# Patient Record
Sex: Female | Born: 1935 | Race: White | Hispanic: No | Smoking: Never smoker
Health system: Southern US, Community
[De-identification: ages and names within clinical notes are randomized; demographics above are authoritative.]

## PROBLEM LIST (undated history)

## (undated) DIAGNOSIS — T4145XA Adverse effect of unspecified anesthetic, initial encounter: Secondary | ICD-10-CM

## (undated) DIAGNOSIS — M1990 Unspecified osteoarthritis, unspecified site: Secondary | ICD-10-CM

## (undated) DIAGNOSIS — I499 Cardiac arrhythmia, unspecified: Secondary | ICD-10-CM

## (undated) DIAGNOSIS — I951 Orthostatic hypotension: Secondary | ICD-10-CM

## (undated) DIAGNOSIS — N183 Chronic kidney disease, stage 3 (moderate): Secondary | ICD-10-CM

## (undated) DIAGNOSIS — F0390 Unspecified dementia without behavioral disturbance: Secondary | ICD-10-CM

## (undated) DIAGNOSIS — T8859XA Other complications of anesthesia, initial encounter: Secondary | ICD-10-CM

## (undated) DIAGNOSIS — Z9181 History of falling: Secondary | ICD-10-CM

## (undated) DIAGNOSIS — C801 Malignant (primary) neoplasm, unspecified: Secondary | ICD-10-CM

## (undated) HISTORY — PX: CHOLECYSTECTOMY: SHX55

## (undated) HISTORY — PX: ABDOMINAL HYSTERECTOMY: SHX81

## (undated) HISTORY — PX: BLADDER SURGERY: SHX569

---

## 2013-08-20 DIAGNOSIS — I951 Orthostatic hypotension: Secondary | ICD-10-CM

## 2013-08-20 DIAGNOSIS — N183 Chronic kidney disease, stage 3 unspecified: Secondary | ICD-10-CM

## 2013-08-20 HISTORY — DX: Chronic kidney disease, stage 3 unspecified: N18.30

## 2013-08-20 HISTORY — DX: Orthostatic hypotension: I95.1

## 2013-08-24 ENCOUNTER — Encounter (HOSPITAL_COMMUNITY): Payer: Self-pay | Admitting: Emergency Medicine

## 2013-08-24 ENCOUNTER — Emergency Department (HOSPITAL_COMMUNITY): Payer: Medicare Other

## 2013-08-24 ENCOUNTER — Emergency Department (HOSPITAL_COMMUNITY)
Admission: EM | Admit: 2013-08-24 | Discharge: 2013-08-24 | Disposition: A | Discharge status: Home / Self Care | Payer: Medicare Other | Attending: Emergency Medicine | Admitting: Emergency Medicine

## 2013-08-24 DIAGNOSIS — Z79899 Other long term (current) drug therapy: Secondary | ICD-10-CM | POA: Insufficient documentation

## 2013-08-24 DIAGNOSIS — IMO0002 Reserved for concepts with insufficient information to code with codable children: Secondary | ICD-10-CM | POA: Insufficient documentation

## 2013-08-24 DIAGNOSIS — M79609 Pain in unspecified limb: Secondary | ICD-10-CM

## 2013-08-24 DIAGNOSIS — M7989 Other specified soft tissue disorders: Secondary | ICD-10-CM

## 2013-08-24 DIAGNOSIS — G8911 Acute pain due to trauma: Secondary | ICD-10-CM | POA: Insufficient documentation

## 2013-08-24 DIAGNOSIS — M79603 Pain in arm, unspecified: Secondary | ICD-10-CM

## 2013-08-24 DIAGNOSIS — R079 Chest pain, unspecified: Secondary | ICD-10-CM | POA: Insufficient documentation

## 2013-08-24 DIAGNOSIS — M129 Arthropathy, unspecified: Secondary | ICD-10-CM | POA: Insufficient documentation

## 2013-08-24 HISTORY — DX: Unspecified osteoarthritis, unspecified site: M19.90

## 2013-08-24 LAB — BASIC METABOLIC PANEL
BUN: 25 mg/dL — ABNORMAL HIGH (ref 6–23)
CO2: 25 mEq/L (ref 19–32)
CREATININE: 1.2 mg/dL — AB (ref 0.50–1.10)
Calcium: 9.4 mg/dL (ref 8.4–10.5)
Chloride: 100 mEq/L (ref 96–112)
GFR calc non Af Amer: 40 mL/min — ABNORMAL LOW (ref >90)
GFR, EST AFRICAN AMERICAN: 47 mL/min — AB (ref >90)
Glucose, Bld: 118 mg/dL — ABNORMAL HIGH (ref 70–99)
POTASSIUM: 4.2 meq/L (ref 3.7–5.3)
Sodium: 138 mEq/L (ref 137–147)

## 2013-08-24 LAB — CBC
HCT: 32.3 % — ABNORMAL LOW (ref 36.0–46.0)
HEMOGLOBIN: 11 g/dL — AB (ref 12.0–15.0)
MCH: 31.4 pg (ref 26.0–34.0)
MCHC: 34.1 g/dL (ref 30.0–36.0)
MCV: 92.3 fL (ref 78.0–100.0)
Platelets: 200 10*3/uL (ref 150–400)
RBC: 3.5 MIL/uL — ABNORMAL LOW (ref 3.87–5.11)
RDW: 13.8 % (ref 11.5–15.5)
WBC: 7.6 10*3/uL (ref 4.0–10.5)

## 2013-08-24 LAB — I-STAT TROPONIN, ED
TROPONIN I, POC: 0 ng/mL (ref 0.00–0.08)
TROPONIN I, POC: 0.01 ng/mL (ref 0.00–0.08)

## 2013-08-24 MED ORDER — FENTANYL CITRATE 0.05 MG/ML IJ SOLN
50.0000 ug | Freq: Once | INTRAMUSCULAR | Status: AC
  Administered 2013-08-24: 50 ug via INTRAVENOUS
  Filled 2013-08-24: qty 2

## 2013-08-24 MED ORDER — HYDROMORPHONE HCL 4 MG PO TABS: 4.0000 mg | ORAL_TABLET | ORAL | Status: DC | PRN

## 2013-08-24 NOTE — ED Notes (Signed)
Dopplar tech at bedside.

## 2013-08-24 NOTE — Discharge Instructions (Signed)
You came to the ER today with swelling and pain of the arm. We did an ultrasound to make sure you do not have a blood clot in your arm. It did not show a blood clot. You will need to followup with your orthopedic doctor as an outpatient. In the meantime, I am prescribing a some stronger pain medication to use this weekend.  Your caregiver has diagnosed you as having chest pain that is not specific for one problem, but does not require admission.  You are at low risk for an acute heart condition or other serious illness. Chest pain comes from many different causes.  SEEK IMMEDIATE MEDICAL ATTENTION IF: You have severe chest pain, especially if the pain is crushing or pressure-like and spreads to the arms, back, neck, or jaw, or if you have sweating, nausea (feeling sick to your stomach), or shortness of breath. THIS IS AN EMERGENCY. Don't wait to see if the pain will go away. Get medical help at once. Call 911 or 0 (operator). DO NOT drive yourself to the hospital.  Your chest pain gets worse and does not go away with rest.  You have an attack of chest pain lasting longer than usual, despite rest and treatment with the medications your caregiver has prescribed.  You wake from sleep with chest pain or shortness of breath.  You feel dizzy or faint.  You have chest pain not typical of your usual pain for which you originally saw your caregiver.

## 2013-08-24 NOTE — ED Provider Notes (Signed)
CSN: [id number]     Arrival date & time 08/24/13  1229 History   None    Chief Complaint  Patient presents with  . Arm Injury     (Consider location/radiation/quality/duration/timing/severity/associated sxs/prior Treatment) HPI  Patient is an 80 year old female who fell on May 21 at home, and injured her left arm. She was at her home when she fell, and according to her daughter she laid in a puddle of blood for a while. She was taken to [facility] ER where she was evaluated and put in an arm sling. At that ER visit she was diagnosed with a broken nose and had a nasal laceration sutured. She then followed up with an ortho office on June 2 and had xrays which diagnosed a humeral fracture and anterior shoulder displacement. Prior to that visit she apparently had an episode of stiffening and staring, so the orthopedic physician recommended she be evaluated in the ER. She was then seen later that day at the ER at [facility], and was seen by neurology who recommended starting her on florinef and compression hose for orthostatic hypotension.  Since the fall, pt has lived with her daughter. She presents today for increased swelling and pain in her left arm. Previously pain had been controlled by tramadol and hydrocodone but since yesterday has had worsening swelling and pain. Daughter brought her in to the ER for further evaluation. Daughter states pt has been confused and out of it while on the pain medication. Last took hydrocodone around 2pm today.  Daughter states pt had an episode of chest pain yesterday and earlier today. Pt now denies having chest pain at this time and states she feels sleepy. She currently has a cardiac monitor in place to evaluate for possible paroxysmal afib as an outpatient. Dtr also states that pt has had difficulty swallowing since she has been living with her.  Level V caveat applies as pt is not oriented and does not meaningfully provide history. All hx obtained  from daughter.   Past Medical History  Diagnosis Date  . Arthritis    Past Surgical History  Procedure Laterality Date  . Abdominal hysterectomy    . Cholecystectomy     No family history on file. History  Substance Use Topics  . Smoking status: Never Smoker   . Smokeless tobacco: Not on file  . Alcohol Use: No   OB History   Grav Para Term Preterm Abortions TAB SAB Ect Mult Living                 Review of Systems  Unable to perform ROS Patient minimally verbal and not oriented to time or location.    Allergies  Keflex and Levaquin  Home Medications   Prior to Admission medications   Medication Sig Start Date End Date Taking? Authorizing Provider  beta carotene w/minerals (OCUVITE) tablet Take 1 tablet by mouth 2 (two) times daily.   Yes Historical Provider, MD  fludrocortisone (FLORINEF) 0.1 MG tablet Take 0.1 mg by mouth daily.   Yes Historical Provider, MD  Pediatric Multiple Vit-C-FA (MULTIVITAMIN ANIMAL SHAPES, WITH CA/FA,) WITH C & FA CHEW chewable tablet Chew 1 tablet by mouth 2 (two) times daily.   Yes Historical Provider, MD  pravastatin (PRAVACHOL) 20 MG tablet Take 20 mg by mouth every morning.   Yes Historical Provider, MD  HYDROmorphone (DILAUDID) 4 MG tablet Take 1 tablet (4 mg total) by mouth every 4 (four) hours as needed for severe pain. 08/24/13  Leeanne Rio, MD   BP 151/63  Pulse 66  Temp(Src) 98 F (36.7 C) (Oral)  Resp 16  Ht 5\' 3"  (1.6 m)  Wt 110 lb (49.896 kg)  BMI 19.49 kg/m2  SpO2 96% Physical Exam  Constitutional: She appears well-developed and well-nourished. No distress.  HENT:  Head: Normocephalic.  bruising present over left side of face  Eyes: Pupils are equal, round, and reactive to light. Right eye exhibits no discharge. Left eye exhibits no discharge.  Neck:  No posterior neck tenderness to palpation  Cardiovascular: Normal rate and regular rhythm.   Pulses:      Radial pulses are 2+ on the left side.   Pulmonary/Chest: Effort normal and breath sounds normal.  Abdominal: Soft. Bowel sounds are normal. She exhibits no distension. There is no tenderness.  Musculoskeletal:       Left elbow: She exhibits swelling and effusion.       Left wrist: She exhibits swelling and effusion.       Left forearm: She exhibits swelling and edema.       Left hand: She exhibits swelling.  Diffuse swelling and bruising over entirety of left elbow, forearm, wrist, and hand.  Neurological:  Minimally verbal. Sits and stares but does respond to questioning. Oriented to person. Thinks she is in [place] she is at a hospital. States year is 59.  Skin: Bruising and ecchymosis noted.    ED Course  Procedures (including critical care time) Labs Review Labs Reviewed  CBC - Abnormal; Notable for the following:    RBC 3.50 (*)    Hemoglobin 11.0 (*)    HCT 32.3 (*)    All other components within normal limits  BASIC METABOLIC PANEL - Abnormal; Notable for the following:    Glucose, Bld 118 (*)    BUN 25 (*)    Creatinine, Ser 1.20 (*)    GFR calc non Af Amer 40 (*)    GFR calc Af Amer 47 (*)    All other components within normal limits  I-STAT TROPOININ, ED  Randolm Idol, ED    Imaging Review Dg Chest 2 View  08/24/2013   CLINICAL DATA:  Chest pain.  EXAM: CHEST  2 VIEW  COMPARISON:  08/09/2013 as well as left clavicle series 530 15.  FINDINGS: Patient is rotated to the left. Lungs are adequately inflated without focal consolidation or effusion. A metallic device overlies the left costophrenic angle. Cardiomediastinal silhouette is within normal. There is minimal calcified plaque over the thoracic aorta. No change in patient's known old left humeral head/ neck deformity and no change in patient's known left shoulder dislocation. Old right humeral neck fracture. There is a mild-to-moderate compression deformity over the lower thoracic spine new since 2012 although age is indeterminate.  IMPRESSION:  No acute cardiopulmonary disease.  Known dislocated left shoulder an old left humeral head/neck deformity.  Mild to moderate lower thoracic spine compression fracture age indeterminate but new since 2012.   Electronically Signed   By: Marin Olp M.D.   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 Doppler ultrasound of the patient's left arm was completed to rule out a DVT. This was negative for any blood clot. We're unable to perform any reduction of her shoulder due to  the chronic nature, and risk of injury to the axillary artery. This will need to be followed up as an outpatient by her orthopedist.  Regarding her chest pain, and history was vague, with patient not able to provide much meaningful history. She had an EKG which did not show any signs of ischemia and 2 negative troponins. She's felt to be low risk for any acute coronary syndrome, and can safely be discharged home. Her daughter is agreeable to discharge.  She'll be provided with Dilaudid orally to be taken this weekend for worsening pain, and has been instructed to call and schedule a followup appointment with her orthopedic doctor on Monday. She'll also need to follow up with her primary care physician about the difficulty swallowing.  Chrisandra Netters, MD Family Medicine PGY-2     Leeanne Rio, MD 08/24/13 2239

## 2013-08-24 NOTE — ED Provider Notes (Signed)
I saw and evaluated the patient, reviewed the resident's note and I agree with the findings and plan.   EKG Interpretation   Date/Time:  Friday August 24 2013 20:38:08 EDT Ventricular Rate:  80 PR Interval:  138 QRS Duration: 84 QT Interval:  379 QTC Calculation: 437 R Axis:   1 Text Interpretation:  Sinus rhythm Atrial premature complexes Anteroseptal  infarct, old Borderline repolarization abnormality No significant change  since last tracing Confirmed by [facility]  MD, Jenny Reichmann ([id number]) on 08/25/2013  1:51:34 AM       EKG Interpretation   Date/Time:  Friday August 24 2013 20:38:08 EDT Ventricular Rate:  80 PR Interval:  138 QRS Duration: 84 QT Interval:  379 QTC Calculation: 437 R Axis:   1 Text Interpretation:  Sinus rhythm Atrial premature complexes Anteroseptal  infarct, old Borderline repolarization abnormality No significant change  since last tracing Confirmed by [facility]  MD, Jenny Reichmann ([id number]) on 08/25/2013  1:51:34 AM     [address] 3 weeks ago with fracture dislocation left shoulder now chronic fracture dislocation left shoulder never reduced by emergency department or orthopedics in [place] [facility] or [facility] awaits outpatient orthopedic followup now presents with 2 days worsening pain and swelling left arm not controlled with Percocet at home had brief spells less than 5 minutes each of chest pain yesterday and today and tonight cannot further be described due to the patient's confusion which she has at baseline since the patient has confusion since her fall and on pain medicines she is more confused when she takes her pain medicines but goes back to baseline confusion with pain medicines wear off she is oriented to person only and knows she is at a hospital but does not know exactly where she is never oriented to time and usually is oriented to place; left arm has deformity of the left shoulder and upper arm consistent with fracture dislocation she is swelling tenderness and ecchymosis upper  arm and proximal forearm with soft easily compressible soft tissues of the upper arm and proximal forearm the wrist and hand are nontender without swelling left hand has capillary refill less than 2 seconds normal light touch in intact strength distributions of the median radial and ulnar function with a radial pulse intact.  Babette Relic, MD 08/26/13 [phone]

## 2013-08-24 NOTE — Progress Notes (Signed)
VASCULAR LAB PRELIMINARY  PRELIMINARY  PRELIMINARY  PRELIMINARY  Left upper extremity venous duplex completed.    Preliminary report:  Left:  No evidence of DVT or superficial thrombosis.    Nani Ravens, RVT 08/24/2013, 7:51 PM

## 2013-08-24 NOTE — ED Notes (Signed)
Per pt's daughter, pt fell on may 21st was evaluated for that and told the pt had a dislocated left shoulder and fracture. Pt has been c/o extreme pain at home, daughter has been giving her hydrocodone and tramadol at home. sts they have tried ice and elevation at home but no relief. Is supposed to follow up with an orthopedic but wasn't able to get an appointment until June 18th. Pt has increased swelling in left lower arm. Pt currently wearing an arm sling. Nad, skin warm and dry, resp e/u.

## 2013-08-30 ENCOUNTER — Inpatient Hospital Stay (HOSPITAL_COMMUNITY): Payer: Medicare Other

## 2013-08-30 ENCOUNTER — Emergency Department (HOSPITAL_COMMUNITY): Payer: Medicare Other

## 2013-08-30 ENCOUNTER — Encounter (HOSPITAL_COMMUNITY): Payer: Self-pay | Admitting: Radiology

## 2013-08-30 ENCOUNTER — Inpatient Hospital Stay (HOSPITAL_COMMUNITY)
Admission: EM | Admit: 2013-08-30 | Discharge: 2013-09-06 | DRG: 483 | Disposition: A | Discharge status: Skilled Nursing Facility | Payer: Medicare Other | Attending: Internal Medicine | Admitting: Internal Medicine

## 2013-08-30 DIAGNOSIS — R627 Adult failure to thrive: Secondary | ICD-10-CM | POA: Diagnosis present

## 2013-08-30 DIAGNOSIS — K5900 Constipation, unspecified: Secondary | ICD-10-CM | POA: Diagnosis present

## 2013-08-30 DIAGNOSIS — F05 Delirium due to known physiological condition: Secondary | ICD-10-CM

## 2013-08-30 DIAGNOSIS — Z85828 Personal history of other malignant neoplasm of skin: Secondary | ICD-10-CM | POA: Diagnosis not present

## 2013-08-30 DIAGNOSIS — R5383 Other fatigue: Secondary | ICD-10-CM | POA: Diagnosis present

## 2013-08-30 DIAGNOSIS — I951 Orthostatic hypotension: Secondary | ICD-10-CM | POA: Diagnosis present

## 2013-08-30 DIAGNOSIS — G5630 Lesion of radial nerve, unspecified upper limb: Secondary | ICD-10-CM | POA: Diagnosis present

## 2013-08-30 DIAGNOSIS — N179 Acute kidney failure, unspecified: Secondary | ICD-10-CM | POA: Diagnosis present

## 2013-08-30 DIAGNOSIS — S42209A Unspecified fracture of upper end of unspecified humerus, initial encounter for closed fracture: Principal | ICD-10-CM | POA: Diagnosis present

## 2013-08-30 DIAGNOSIS — S4292XA Fracture of left shoulder girdle, part unspecified, initial encounter for closed fracture: Secondary | ICD-10-CM | POA: Diagnosis present

## 2013-08-30 DIAGNOSIS — F19921 Other psychoactive substance use, unspecified with intoxication with delirium: Secondary | ICD-10-CM | POA: Diagnosis present

## 2013-08-30 DIAGNOSIS — R1310 Dysphagia, unspecified: Secondary | ICD-10-CM | POA: Diagnosis present

## 2013-08-30 DIAGNOSIS — S42302A Unspecified fracture of shaft of humerus, left arm, initial encounter for closed fracture: Secondary | ICD-10-CM | POA: Diagnosis present

## 2013-08-30 DIAGNOSIS — G9340 Encephalopathy, unspecified: Secondary | ICD-10-CM

## 2013-08-30 DIAGNOSIS — R5381 Other malaise: Secondary | ICD-10-CM | POA: Diagnosis present

## 2013-08-30 DIAGNOSIS — W19XXXA Unspecified fall, initial encounter: Secondary | ICD-10-CM | POA: Diagnosis present

## 2013-08-30 DIAGNOSIS — Z79899 Other long term (current) drug therapy: Secondary | ICD-10-CM | POA: Diagnosis not present

## 2013-08-30 DIAGNOSIS — G9341 Metabolic encephalopathy: Secondary | ICD-10-CM | POA: Diagnosis present

## 2013-08-30 DIAGNOSIS — E860 Dehydration: Secondary | ICD-10-CM | POA: Diagnosis present

## 2013-08-30 HISTORY — DX: Adverse effect of unspecified anesthetic, initial encounter: T41.45XA

## 2013-08-30 HISTORY — DX: Orthostatic hypotension: I95.1

## 2013-08-30 HISTORY — DX: Other complications of anesthesia, initial encounter: T88.59XA

## 2013-08-30 HISTORY — DX: Malignant (primary) neoplasm, unspecified: C80.1

## 2013-08-30 LAB — RAPID URINE DRUG SCREEN, HOSP PERFORMED
AMPHETAMINES: NOT DETECTED
Barbiturates: NOT DETECTED
Benzodiazepines: NOT DETECTED
COCAINE: NOT DETECTED
OPIATES: POSITIVE — AB
Tetrahydrocannabinol: NOT DETECTED

## 2013-08-30 LAB — CBC WITH DIFFERENTIAL/PLATELET
Basophils Absolute: 0 10*3/uL (ref 0.0–0.1)
Basophils Relative: 0 % (ref 0–1)
EOS PCT: 1 % (ref 0–5)
Eosinophils Absolute: 0.1 10*3/uL (ref 0.0–0.7)
HEMATOCRIT: 33 % — AB (ref 36.0–46.0)
HEMOGLOBIN: 10.8 g/dL — AB (ref 12.0–15.0)
LYMPHS ABS: 1.3 10*3/uL (ref 0.7–4.0)
LYMPHS PCT: 16 % (ref 12–46)
MCH: 30.9 pg (ref 26.0–34.0)
MCHC: 32.7 g/dL (ref 30.0–36.0)
MCV: 94.3 fL (ref 78.0–100.0)
MONO ABS: 0.7 10*3/uL (ref 0.1–1.0)
MONOS PCT: 8 % (ref 3–12)
NEUTROS ABS: 6.3 10*3/uL (ref 1.7–7.7)
Neutrophils Relative %: 75 % (ref 43–77)
Platelets: 209 10*3/uL (ref 150–400)
RBC: 3.5 MIL/uL — AB (ref 3.87–5.11)
RDW: 14 % (ref 11.5–15.5)
WBC: 8.4 10*3/uL (ref 4.0–10.5)

## 2013-08-30 LAB — COMPREHENSIVE METABOLIC PANEL
ALT: 13 U/L (ref 0–35)
AST: 21 U/L (ref 0–37)
Albumin: 3.2 g/dL — ABNORMAL LOW (ref 3.5–5.2)
Alkaline Phosphatase: 114 U/L (ref 39–117)
BUN: 29 mg/dL — AB (ref 6–23)
CALCIUM: 9.6 mg/dL (ref 8.4–10.5)
CO2: 25 mEq/L (ref 19–32)
Chloride: 99 mEq/L (ref 96–112)
Creatinine, Ser: 1.33 mg/dL — ABNORMAL HIGH (ref 0.50–1.10)
GFR calc non Af Amer: 36 mL/min — ABNORMAL LOW (ref >90)
GFR, EST AFRICAN AMERICAN: 41 mL/min — AB (ref >90)
GLUCOSE: 120 mg/dL — AB (ref 70–99)
Potassium: 4.5 mEq/L (ref 3.7–5.3)
Sodium: 138 mEq/L (ref 137–147)
TOTAL PROTEIN: 6.8 g/dL (ref 6.0–8.3)
Total Bilirubin: 0.5 mg/dL (ref 0.3–1.2)

## 2013-08-30 LAB — URINALYSIS, ROUTINE W REFLEX MICROSCOPIC
Bilirubin Urine: NEGATIVE
GLUCOSE, UA: NEGATIVE mg/dL
HGB URINE DIPSTICK: NEGATIVE
Ketones, ur: 15 mg/dL — AB
Leukocytes, UA: NEGATIVE
Nitrite: NEGATIVE
Protein, ur: NEGATIVE mg/dL
SPECIFIC GRAVITY, URINE: 1.024 (ref 1.005–1.030)
Urobilinogen, UA: 1 mg/dL (ref 0.0–1.0)
pH: 5 (ref 5.0–8.0)

## 2013-08-30 LAB — I-STAT CHEM 8, ED
BUN: 28 mg/dL — ABNORMAL HIGH (ref 6–23)
CALCIUM ION: 1.17 mmol/L (ref 1.13–1.30)
CREATININE: 1.3 mg/dL — AB (ref 0.50–1.10)
Chloride: 103 mEq/L (ref 96–112)
Glucose, Bld: 117 mg/dL — ABNORMAL HIGH (ref 70–99)
HCT: 34 % — ABNORMAL LOW (ref 36.0–46.0)
Hemoglobin: 11.6 g/dL — ABNORMAL LOW (ref 12.0–15.0)
Potassium: 4.3 mEq/L (ref 3.7–5.3)
Sodium: 137 mEq/L (ref 137–147)
TCO2: 24 mmol/L (ref 0–100)

## 2013-08-30 LAB — I-STAT TROPONIN, ED: Troponin i, poc: 0.01 ng/mL (ref 0.00–0.08)

## 2013-08-30 LAB — CBG MONITORING, ED: Glucose-Capillary: 111 mg/dL — ABNORMAL HIGH (ref 70–99)

## 2013-08-30 LAB — ETHANOL: Alcohol, Ethyl (B): <11 mg/dL (ref 0–11)

## 2013-08-30 MED ORDER — OXYCODONE-ACETAMINOPHEN 5-325 MG PO TABS: 1.0000 | ORAL_TABLET | Freq: Once | ORAL | Status: DC

## 2013-08-30 MED ORDER — SIMVASTATIN 10 MG PO TABS
10.0000 mg | ORAL_TABLET | Freq: Every day | ORAL | Status: DC
  Administered 2013-09-01 – 2013-09-05 (×5): 10 mg via ORAL
  Filled 2013-08-30 (×8): qty 1

## 2013-08-30 MED ORDER — OCUVITE PO TABS
1.0000 | ORAL_TABLET | Freq: Two times a day (BID) | ORAL | Status: DC
  Administered 2013-08-31 – 2013-09-06 (×11): 1 via ORAL
  Filled 2013-08-30 (×16): qty 1

## 2013-08-30 MED ORDER — SODIUM CHLORIDE 0.9 % IV SOLN
INTRAVENOUS | Status: DC
  Administered 2013-08-30: 23:00:00 via INTRAVENOUS

## 2013-08-30 MED ORDER — HEPARIN SODIUM (PORCINE) 5000 UNIT/ML IJ SOLN
5000.0000 [IU] | Freq: Three times a day (TID) | INTRAMUSCULAR | Status: AC
  Administered 2013-08-30 – 2013-09-02 (×10): 5000 [IU] via SUBCUTANEOUS
  Filled 2013-08-30 (×11): qty 1

## 2013-08-30 MED ORDER — HYDROMORPHONE HCL PF 1 MG/ML IJ SOLN
0.5000 mg | INTRAMUSCULAR | Status: DC | PRN
  Administered 2013-08-30: 0.5 mg via INTRAVENOUS
  Administered 2013-08-31 (×2): 1 mg via INTRAVENOUS
  Filled 2013-08-30 (×3): qty 1

## 2013-08-30 MED ORDER — LORAZEPAM 0.5 MG PO TABS: 0.5000 mg | ORAL_TABLET | Freq: Three times a day (TID) | ORAL | Status: DC

## 2013-08-30 NOTE — ED Notes (Signed)
Portable at bedside 

## 2013-08-30 NOTE — ED Notes (Signed)
Pt daughter states that she is usual verbal, and very talkative. Pt for the past week has been generalized weakness with an inability to stand/bare weight. Pt non-verbal during the assessment. Daughter concerned about change in mother's well being for the past week.

## 2013-08-30 NOTE — H&P (Signed)
[facility] History and Physical  Kerri Hurst CWC:[id number] DOB: 1927-03-12 DOA: 08/30/2013  Referring physician: EDP PCP: Houston Siren, MD   Chief Complaint: AMS   HPI: Kerri Hurst is a 80 y.o. female who presents to the ED with decreased activity, PO intake, and inability to get out of bed.  Symptoms have been going on to some degree since a fall back on 08/09/13.  At that time she dislocated and fractured her L shoulder.  It was apparently not diagnosed at the initial ED visit and only diagnosed after when she was seen at another ER.  At that time a follow up appointment for today 6/11 was made with ortho.  She was put on norco initially after the injury and po dilaudid was added for pain control on Friday of last week.  Since that time she has been more sleepy and less active.  She has been poorly ambulatory since the initial injury and not at all for the past couple of days.  With regards to decreased PO intake, daughter states either patient states she is in too much pain and therefore not hungry, or if given dilaudid then patients pain is controlled but she is asleep and therefore not eating.  Review of Systems: Systems reviewed.  As above, otherwise negative  Past Medical History  Diagnosis Date  . Arthritis    Past Surgical History  Procedure Laterality Date  . Abdominal hysterectomy    . Cholecystectomy     Social History:  reports that she has never smoked. She does not have any smokeless tobacco history on file. She reports that she does not drink alcohol or use illicit drugs.  Allergies  Allergen Reactions  . Aricept [Donepezil Hcl] Other (See Comments)    Night mares   . Boniva [Ibandronic Acid] Other (See Comments)    Doesn't remember   . Cymbalta [Duloxetine Hcl] Other (See Comments)    Night mares   . Levaquin [Levofloxacin]     nightmares  . Oxybutynin Chloride     Doesn't remember   . Silver Sulfadiazine     Doesn't remember   . Tegaderm  Ag Mesh [Silver]   . Keflex [Cephalexin] Rash    No family history on file.   Prior to Admission medications   Medication Sig Start Date End Date Taking? Authorizing Provider  beta carotene w/minerals (OCUVITE) tablet Take 1 tablet by mouth 2 (two) times daily.   Yes Historical Provider, MD  HYDROcodone-acetaminophen (NORCO) 7.5-325 MG per tablet Take 1 tablet by mouth every 6 (six) hours as needed for moderate pain.   Yes Historical Provider, MD  HYDROmorphone (DILAUDID) 4 MG tablet Take 1 tablet (4 mg total) by mouth every 4 (four) hours as needed for severe pain. 08/24/13  Yes Leeanne Rio, MD  LORazepam (ATIVAN) 0.5 MG tablet Take 0.5 mg by mouth every 8 (eight) hours.   Yes Historical Provider, MD  Pediatric Multiple Vit-C-FA (MULTIVITAMIN ANIMAL SHAPES, WITH CA/FA,) WITH C & FA CHEW chewable tablet Chew 1 tablet by mouth 2 (two) times daily.   Yes Historical Provider, MD  pravastatin (PRAVACHOL) 20 MG tablet Take 20 mg by mouth every morning.   Yes Historical Provider, MD   Physical Exam: Filed Vitals:   08/30/13 2031  BP: 138/58  Pulse: 94  Temp:   Resp: 14    BP 138/58  Pulse 94  Temp(Src) 98.7 F (37.1 C) (Oral)  Resp 14  SpO2 96%  General Appearance:    Alert,  oriented, no distress, appears stated age  Head:    Normocephalic, atraumatic  Eyes:    PERRL, EOMI, sclera non-icteric        Nose:   Nares without drainage or epistaxis. Mucosa, turbinates normal  Throat:   Moist mucous membranes. Oropharynx without erythema or exudate.  Neck:   Supple. No carotid bruits.  No thyromegaly.  No lymphadenopathy.   Back:     No CVA tenderness, no spinal tenderness  Lungs:     Clear to auscultation bilaterally, without wheezes, rhonchi or rales  Chest wall:    No tenderness to palpitation  Heart:    Regular rate and rhythm without murmurs, gallops, rubs  Abdomen:     Soft, non-tender, nondistended, normal bowel sounds, no organomegaly  Genitalia:    deferred  Rectal:     deferred  Extremities:   No clubbing, cyanosis, has edema of the L arm and shoulder is dislocated.  Pulses:   2+ and symmetric all extremities  Skin:   Skin color, texture, turgor normal, no rashes or lesions  Lymph nodes:   Cervical, supraclavicular, and axillary nodes normal  Neurologic:   CNII-XII intact. Normal strength, sensation and reflexes      throughout    Labs on Admission:  Basic Metabolic Panel:  Recent Labs Lab 08/24/13 1732 08/30/13 1700 08/30/13 1717  NA 138 138 137  K 4.2 4.5 4.3  CL 100 99 103  CO2 25 25  --   GLUCOSE 118* 120* 117*  BUN 25* 29* 28*  CREATININE 1.20* 1.33* 1.30*  CALCIUM 9.4 9.6  --    Liver Function Tests:  Recent Labs Lab 08/30/13 1700  AST 21  ALT 13  ALKPHOS 114  BILITOT 0.5  PROT 6.8  ALBUMIN 3.2*   No results found for this basename: LIPASE, AMYLASE,  in the last 168 hours No results found for this basename: AMMONIA,  in the last 168 hours CBC:  Recent Labs Lab 08/24/13 1732 08/30/13 1700 08/30/13 1717  WBC 7.6 8.4  --   NEUTROABS  --  6.3  --   HGB 11.0* 10.8* 11.6*  HCT 32.3* 33.0* 34.0*  MCV 92.3 94.3  --   PLT 200 209  --    Cardiac Enzymes: No results found for this basename: CKTOTAL, CKMB, CKMBINDEX, TROPONINI,  in the last 168 hours  BNP (last 3 results) No results found for this basename: PROBNP,  in the last 8760 hours CBG:  Recent Labs Lab 08/30/13 1710  GLUCAP 111*    Radiological Exams on Admission: Ct Head Wo Contrast  08/30/2013   CLINICAL DATA:  Altered mental status with weakness.  EXAM: CT HEAD WITHOUT CONTRAST  TECHNIQUE: Contiguous axial images were obtained from the base of the skull through the vertex without contrast.  COMPARISON:  08/09/2013.  FINDINGS: Generalized atrophy. Chronic microvascular ischemic change. No evidence for acute infarction, hemorrhage, mass lesion, hydrocephalus, or extra-axial fluid. Vascular calcification. Calvarium intact. No sinus or mastoid air fluid levels.  Similar appearance to priors.  IMPRESSION: No acute intracranial abnormality. Chronic changes as described, similar to priors.   Electronically Signed   By: Rolla Flatten M.D.   On: 08/30/2013 19:20   Dg Chest Portable 1 View  08/30/2013   CLINICAL DATA:  ALTERED MENTAL STATUS WEAKNESS  EXAM: PORTABLE CHEST - 1 VIEW  COMPARISON:  08/24/2013  FINDINGS: Comminuted left humerus fracture with dislocation of the glenohumeral joint.  Normal heart size.  Lungs hyper aerated and clear.  IMPRESSION: No active cardiopulmonary disease.   Electronically Signed   By: Maryclare Bean M.D.   On: 08/30/2013 17:34    EKG: Independently reviewed.  Assessment/Plan Principal Problem:   Traumatic closed displaced fracture of left shoulder with anterior dislocation Active Problems:   Delirium due to general medical condition   Fracture dislocation of left shoulder joint   Orthostatic hypotension   1. Traumatic closed comminuted fracture of L shoulder with dislocation - has failed conservative therapy at this point, patients shoulder is still broke and out of joint 20 days after injury, pain is still so severe that she essentially requires enough narcotics for conscious sedation for control.  This has resulted in worsening mental status and decreased PO intake.  Ortho consulted, feel that patient needs definitive management. 2. Orthostatic hypotension - caution when standing, partially due to dehydration possibly so will put on maintenance fluids.  Further work up of this pending treatment of primary issue.  Code Status: Full Code  Family Communication: Daughter at bedside Disposition Plan: Admit to inpatient   Time spent: 56 min  GARDNER, JARED M. [facility] Pager [phone]  If 7AM-7PM, please contact the day team taking care of the patient [email or web address] Password [facility] 08/30/2013, 9:09 PM

## 2013-08-30 NOTE — ED Provider Notes (Signed)
CSN: [id number]     Arrival date & time 08/30/13  1627 History   First MD Initiated Contact with Patient 08/30/13 1642     Chief Complaint  Patient presents with  . Altered Mental Status  . Weakness     (Consider location/radiation/quality/duration/timing/severity/associated sxs/prior Treatment) Patient is a 80 y.o. female presenting with general illness. The history is provided by the patient and a relative.  Illness Severity:  Severe Onset quality:  Gradual Timing:  Constant Progression:  Worsening Chronicity:  New Associated symptoms: fatigue   Associated symptoms: no abdominal pain, no chest pain, no congestion, no cough, no fever, no headaches, no nausea, no rhinorrhea, no shortness of breath and no vomiting     Fall few weeks ago. Shoulder fracture/dislocation. Non-op. 3 days confusion. Less verbal. Refusing to eat. No recent fall since initial injury. No fevers or cough. Daughter states patient seen 1 week ago for arm swelling. Negative duplex at that time. Swelling unchanged.  Concerned about decreased po and less verbal.  No head injury.  Remote h/o UTI's but not recently.  Had a "spell" like this within past couple weeks. Seen at [facility] and subsequently discharged home.  Started dilaudid po recently 2/2 shoulder pain.  Past Medical History  Diagnosis Date  . Arthritis    Past Surgical History  Procedure Laterality Date  . Abdominal hysterectomy    . Cholecystectomy     No family history on file. History  Substance Use Topics  . Smoking status: Never Smoker   . Smokeless tobacco: Not on file  . Alcohol Use: No   OB History   Grav Para Term Preterm Abortions TAB SAB Ect Mult Living                 Review of Systems  Constitutional: Positive for fatigue. Negative for fever and chills.  HENT: Negative for congestion and rhinorrhea.   Eyes: Negative for pain and visual disturbance.  Respiratory: Negative for cough and shortness of breath.   Cardiovascular:  Negative for chest pain.  Gastrointestinal: Negative for nausea, vomiting and abdominal pain.  Genitourinary: Negative for dysuria, hematuria and flank pain.  Musculoskeletal: Negative for back pain.  Neurological: Positive for weakness (generalized. no focal weakness). Negative for light-headedness and headaches.  All other systems reviewed and are negative.     Allergies  Aricept; Boniva; Cymbalta; Levaquin; Oxybutynin chloride; Silver sulfadiazine; Tegaderm ag mesh; and Keflex  Home Medications   Prior to Admission medications   Medication Sig Start Date End Date Taking? Authorizing Provider  beta carotene w/minerals (OCUVITE) tablet Take 1 tablet by mouth 2 (two) times daily.   Yes Historical Provider, MD  HYDROcodone-acetaminophen (NORCO) 7.5-325 MG per tablet Take 1 tablet by mouth every 6 (six) hours as needed for moderate pain.   Yes Historical Provider, MD  HYDROmorphone (DILAUDID) 4 MG tablet Take 1 tablet (4 mg total) by mouth every 4 (four) hours as needed for severe pain. 08/24/13  Yes Leeanne Rio, MD  LORazepam (ATIVAN) 0.5 MG tablet Take 0.5 mg by mouth every 8 (eight) hours.   Yes Historical Provider, MD  Pediatric Multiple Vit-C-FA (MULTIVITAMIN ANIMAL SHAPES, WITH CA/FA,) WITH C & FA CHEW chewable tablet Chew 1 tablet by mouth 2 (two) times daily.   Yes Historical Provider, MD  pravastatin (PRAVACHOL) 20 MG tablet Take 20 mg by mouth every morning.   Yes Historical Provider, MD   BP 115/65  Pulse 142  Temp(Src) 98.2 F (36.8 C) (Oral)  Resp  18  SpO2 83% Physical Exam  Nursing note and vitals reviewed. Constitutional: She appears well-developed and well-nourished. No distress.  HENT:  Head: Normocephalic and atraumatic.  Eyes: Conjunctivae and EOM are normal. Pupils are equal, round, and reactive to light. Right eye exhibits no discharge. Left eye exhibits no discharge.  Neck: No tracheal deviation present.  Cardiovascular: Normal heart sounds and intact  distal pulses.   Pulmonary/Chest: Effort normal and breath sounds normal. No stridor. No respiratory distress. She has no wheezes. She has no rales.  Abdominal: Soft. She exhibits no distension. There is no tenderness. There is no guarding.  Musculoskeletal: She exhibits edema (LUE) and tenderness (left shoulder ttp and with deformity. swelling to entire arm. radial pulse present. good cap refill. ).  Neurological: She is alert. No cranial nerve deficit or sensory deficit.  Appears fatigued, but easily arousable. Diminished strength to LUE 2/2 pain  Skin: Skin is warm and dry.    ED Course  Procedures (including critical care time) Labs Review Labs Reviewed  COMPREHENSIVE METABOLIC PANEL - Abnormal; Notable for the following:    Glucose, Bld 120 (*)    BUN 29 (*)    Creatinine, Ser 1.33 (*)    Albumin 3.2 (*)    GFR calc non Af Amer 36 (*)    GFR calc Af Amer 41 (*)    All other components within normal limits  CBC WITH DIFFERENTIAL - Abnormal; Notable for the following:    RBC 3.50 (*)    Hemoglobin 10.8 (*)    HCT 33.0 (*)    All other components within normal limits  URINALYSIS, ROUTINE W REFLEX MICROSCOPIC - Abnormal; Notable for the following:    Color, Urine AMBER (*)    APPearance CLOUDY (*)    Ketones, ur 15 (*)    All other components within normal limits  URINE RAPID DRUG SCREEN (HOSP PERFORMED) - Abnormal; Notable for the following:    Opiates POSITIVE (*)    All other components within normal limits  CBG MONITORING, ED - Abnormal; Notable for the following:    Glucose-Capillary 111 (*)    All other components within normal limits  I-STAT CHEM 8, ED - Abnormal; Notable for the following:    BUN 28 (*)    Creatinine, Ser 1.30 (*)    Glucose, Bld 117 (*)    Hemoglobin 11.6 (*)    HCT 34.0 (*)    All other components within normal limits  ETHANOL  CBG MONITORING, ED  I-STAT TROPOININ, ED  I-STAT CG4 LACTIC ACID, ED    Imaging Review Ct Head Wo  Contrast  08/30/2013   CLINICAL DATA:  Altered mental status with weakness.  EXAM: CT HEAD WITHOUT CONTRAST  TECHNIQUE: Contiguous axial images were obtained from the base of the skull through the vertex without contrast.  COMPARISON:  08/09/2013.  FINDINGS: Generalized atrophy. Chronic microvascular ischemic change. No evidence for acute infarction, hemorrhage, mass lesion, hydrocephalus, or extra-axial fluid. Vascular calcification. Calvarium intact. No sinus or mastoid air fluid levels. Similar appearance to priors.  IMPRESSION: No acute intracranial abnormality. Chronic changes as described, similar to priors.   Electronically Signed   By: Rolla Flatten M.D.   On: 08/30/2013 19:20   Dg Chest Portable 1 View  08/30/2013   CLINICAL DATA:  ALTERED MENTAL STATUS WEAKNESS  EXAM: PORTABLE CHEST - 1 VIEW  COMPARISON:  08/24/2013  FINDINGS: Comminuted left humerus fracture with dislocation of the glenohumeral joint.  Normal heart size.  Lungs  hyper aerated and clear.  IMPRESSION: No active cardiopulmonary disease.   Electronically Signed   By: Maryclare Bean M.D.   On: 08/30/2013 17:34   Dg Shoulder Left Port  08/30/2013   CLINICAL DATA:  Shoulder pain  EXAM: PORTABLE LEFT SHOULDER - 2+ VIEW  COMPARISON:  08/18/2013  FINDINGS: There is again noted comminuted fracture of the proximal left humerus with inferior dislocation. The overall appearance is stable from the prior exam. No new focal abnormality is seen.  IMPRESSION: No significant interval change from the previous study   Electronically Signed   By: Inez Catalina M.D.   On: 08/30/2013 22:04     EKG Interpretation   Date/Time:  Thursday August 30 2013 16:56:57 EDT Ventricular Rate:  93 PR Interval:  133 QRS Duration: 81 QT Interval:  348 QTC Calculation: 433 R Axis:   -9 Text Interpretation:  artifact noted sinus rhythm noted Non-specific ST-t  changes Anterior infarct, old Confirmed by Christy Gentles  MD, DONALD ([id number]) on  08/30/2013 5:41:21 PM      MDM    Final diagnoses:  Traumatic closed displaced fracture of left shoulder with anterior dislocation  Delirium due to general medical condition  Fracture dislocation of left shoulder joint  Orthostatic hypotension    AMS. Likely 2/2 narcotic use for pain mgmt of left shoulder.  CT head negative. Without focal neuro findings or complaint to suggest CVA.  History also concerning for postural hypotension.  Labs largely reassuring.  Discussed with hospitalist for admission. Request ortho consult prior to admission.     Bonnita Hollow, MD 08/31/13 [phone]

## 2013-08-30 NOTE — ED Notes (Signed)
PT fell on 5/21 and was dx with L shoulder fracture and dislocation.  For the past 3 days pt has been falling and refusing to eat.  Was taken to MD today and family was told to bring pt to ED.  Pt responsive to voice only.  HR 140's, afib.

## 2013-08-30 NOTE — ED Notes (Signed)
Dr. Sheran Luz ( hospitalist ) explained plan of care and admission to pt. and daughter .

## 2013-08-30 NOTE — ED Provider Notes (Signed)
Patient seen/examined in the Emergency Department in conjunction with Resident Physician Provider [facility] Patient presents for weakness upon standing and fatigue Exam : somnolent but arousable.  Pt is frail and ill appearing but vitals improved/stable BP 119/72  Pulse 93  Temp(Src) 98.7 F (37.1 C) (Oral)  Resp 16  SpO2 96%  Plan: will need admission   Sharyon Cable, MD 08/30/13 1714

## 2013-08-31 ENCOUNTER — Inpatient Hospital Stay (HOSPITAL_COMMUNITY): Payer: Medicare Other

## 2013-08-31 DIAGNOSIS — G9340 Encephalopathy, unspecified: Secondary | ICD-10-CM

## 2013-08-31 DIAGNOSIS — N179 Acute kidney failure, unspecified: Secondary | ICD-10-CM

## 2013-08-31 MED ORDER — ACETAMINOPHEN 325 MG PO TABS
650.0000 mg | ORAL_TABLET | Freq: Four times a day (QID) | ORAL | Status: DC
  Administered 2013-08-31 – 2013-09-01 (×5): 650 mg via ORAL
  Filled 2013-08-31 (×5): qty 2

## 2013-08-31 MED ORDER — TRAMADOL HCL 50 MG PO TABS
50.0000 mg | ORAL_TABLET | Freq: Four times a day (QID) | ORAL | Status: DC | PRN
  Administered 2013-08-31 – 2013-09-04 (×9): 50 mg via ORAL
  Filled 2013-08-31 (×9): qty 1

## 2013-08-31 MED ORDER — SODIUM CHLORIDE 0.9 % IV SOLN
INTRAVENOUS | Status: DC
  Administered 2013-09-01 – 2013-09-02 (×2): via INTRAVENOUS

## 2013-08-31 NOTE — Evaluation (Signed)
Occupational Therapy Evaluation Patient Details Name: Kerri Hurst MRN: [id number] DOB: 1927/02/20 Today's Date: 08/31/2013    History of Present Illness Kerri Hurst is a 80 y.o. female who presents to the ED with decreased activity, PO intake, and inability to get out of bed, AMS. Symptoms have been going on to some degree since a fall back on 08/09/13.  At that time she dislocated and fractured her L shoulder.  It was apparently not diagnosed at the initial ED visit and only diagnosed after when she was seen at another ER.   Clinical Impression   This 80 yo female admitted with above and was independent prior to 08/09/13 except for bathing in tub and IADLs presents to acute OT with decreased use of LUE, decreased balance (sitting and standing), decreased endurance, increased pain all affecting pt's ability to care for herself. She will benefit from acute OT with follow up OT at SNF.    Follow Up Recommendations  SNF    Equipment Recommendations   (TBD next venue)       Precautions / Restrictions Precautions Precautions: Fall;Shoulder Precaution Comments: per daughter pt had a sling, but it was increasing pt's pain and swelling so she is no longer wearing one Restrictions Weight Bearing Restrictions: Yes LUE Weight Bearing: Non weight bearing      Mobility Bed Mobility Overal bed mobility: Needs Assistance Bed Mobility: Supine to Sit;Sit to Supine     Supine to sit: Max assist Sit to supine: Max assist      Transfers Overall transfer level: Needs assistance   Transfers: Sit to/from Stand Sit to Stand: Mod assist              Balance Overall balance assessment: Needs assistance Sitting-balance support: Feet supported;Single extremity supported Sitting balance-Leahy Scale: Poor Sitting balance - Comments: Pt tends to sit with legs adducted Postural control: Posterior lean Standing balance support: No upper extremity supported Standing balance-Leahy  Scale: Zero Standing balance comment: Pt tends to stand with legs adducted; diifficult for pt to get knees bent back under her (more so the LLE--swollen knee)                            ADL                                         General ADL Comments: Currently total A for all BADLs due to lethargy, decreased balance, and decreased use of LUE               Pertinent Vitals/Pain 4/10 FACES, repositioned     Hand Dominance Right   Extremity/Trunk Assessment Upper Extremity Assessment Upper Extremity Assessment: LUE deficits/detail LUE Deficits / Details: Decreased use of arm since 08/09/13 when she felll and broke humerus. (also of note per daughter pt had limited elbow flexion pta from an injury she sustained when she was a child). Pt with edema throughout arm and appears to be contracted  at elbow, pt with stiff digits but can get full  passive extension, at rest wrist is ulnarly deviated. LUE Coordination: decreased fine motor;decreased gross motor   Lower Extremity Assessment Lower Extremity Assessment: Defer to PT evaluation       Communication Communication Communication:  (not talking much and when she does it is low sound)   Cognition Arousal/Alertness: Lethargic Behavior During Therapy: Flat  affect Overall Cognitive Status: Impaired/Different from baseline Area of Impairment: Safety/judgement;Following commands       Following Commands:  (slow to process) Safety/Judgement: Decreased awareness of safety;Decreased awareness of deficits                    Home Living Family/patient expects to be discharged to:: Skilled nursing facility                                        Prior Functioning/Environment Level of Independence: Independent (except for bathing and IADLs)             OT Diagnosis: Generalized weakness;Acute pain   OT Problem List: Decreased strength;Decreased range of motion;Decreased  activity tolerance;Impaired balance (sitting and/or standing);Pain;Decreased cognition;Impaired UE functional use;Decreased knowledge of use of DME or AE   OT Treatment/Interventions: Self-care/ADL training;Therapeutic exercise;DME and/or AE instruction;Therapeutic activities;Balance training;Patient/family education    OT Goals(Current goals can be found in the care plan section) Acute Rehab OT Goals Patient Stated Goal: Did not state OT Goal Formulation: With patient Time For Goal Achievement: 09/14/13 Potential to Achieve Goals: Good  OT Frequency: Min 3X/week (may need to be more due to arm)   Barriers to D/C: Decreased caregiver support             End of Session Equipment Utilized During Treatment: Gait belt  Activity Tolerance: Patient limited by fatigue;Patient limited by lethargy Patient left: in bed;with call bell/phone within reach;with bed alarm set;with family/visitor present   Time: 0822-0852 OT Time Calculation (min): 30 min Charges:  OT General Charges $OT Visit: 1 Procedure OT Evaluation $Initial OT Evaluation Tier I: 1 Procedure OT Treatments $Therapeutic Activity: 23-37 mins  Almon Register [phone] 08/31/2013, 11:33 AM

## 2013-08-31 NOTE — ED Provider Notes (Signed)
I have personally seen and examined the patient.  I have discussed the plan of care with the resident.  I have reviewed the documentation on PMH/FH/Soc. History.  I have reviewed the documentation of the resident and agree.   Sharyon Cable, MD 08/31/13 [phone]

## 2013-08-31 NOTE — Consult Note (Signed)
ORTHOPAEDIC CONSULTATION  REQUESTING PHYSICIAN: [name], DO  Chief Complaint: Left shoulder prox humerus fracture  HPI: Kerri Hurst is a 80 y.o. female who complains of  a fall on 08/08/2013. She was initially seen and [place] [facility] and then later sent to [facility]. She was evaluated there and was sent for followup. She's continued to have distal to the pain control due to her fracture dislocation of her left shoulder. As unclear to me exactly what was done as an inpatient at [facility]. Patient was then admitted overnight due to delirium secondary to narcotic knee. As well as failure to thrive.   I placed some call to have this discovered that she does have followup that will be arranged over there. Her daughter is very concerned about taking her out of the hospital again that will not go to control her pain and is very concerned about getting her to that followup desires to keep her care at [facility].  Past Medical History  Diagnosis Date  . Arthritis    Past Surgical History  Procedure Laterality Date  . Abdominal hysterectomy    . Cholecystectomy     History   Social History  . Marital Status: Widowed    Spouse Name: N/A    Number of Children: N/A  . Years of Education: N/A   Social History Main Topics  . Smoking status: Never Smoker   . Smokeless tobacco: None  . Alcohol Use: No  . Drug Use: No  . Sexual Activity: None   Other Topics Concern  . None   Social History Narrative  . None   No family history on file. Allergies  Allergen Reactions  . Aricept [Donepezil Hcl] Other (See Comments)    Night mares   . Boniva [Ibandronic Acid] Other (See Comments)    Doesn't remember   . Cymbalta [Duloxetine Hcl] Other (See Comments)    Night mares   . Levaquin [Levofloxacin]     nightmares  . Oxybutynin Chloride     Doesn't remember   . Silver Sulfadiazine     Doesn't remember   . Tegaderm Ag Mesh [Silver]   . Keflex [Cephalexin] Rash   Prior to  Admission medications   Medication Sig Start Date End Date Taking? Authorizing Provider  beta carotene w/minerals (OCUVITE) tablet Take 1 tablet by mouth 2 (two) times daily.   Yes Historical Provider, MD  HYDROcodone-acetaminophen (NORCO) 7.5-325 MG per tablet Take 1 tablet by mouth every 6 (six) hours as needed for moderate pain.   Yes Historical Provider, MD  HYDROmorphone (DILAUDID) 4 MG tablet Take 1 tablet (4 mg total) by mouth every 4 (four) hours as needed for severe pain. 08/24/13  Yes Leeanne Rio, MD  LORazepam (ATIVAN) 0.5 MG tablet Take 0.5 mg by mouth every 8 (eight) hours.   Yes Historical Provider, MD  Pediatric Multiple Vit-C-FA (MULTIVITAMIN ANIMAL SHAPES, WITH CA/FA,) WITH C & FA CHEW chewable tablet Chew 1 tablet by mouth 2 (two) times daily.   Yes Historical Provider, MD  pravastatin (PRAVACHOL) 20 MG tablet Take 20 mg by mouth every morning.   Yes Historical Provider, MD   Ct Head Wo Contrast  08/30/2013   CLINICAL DATA:  Altered mental status with weakness.  EXAM: CT HEAD WITHOUT CONTRAST  TECHNIQUE: Contiguous axial images were obtained from the base of the skull through the vertex without contrast.  COMPARISON:  08/09/2013.  FINDINGS: Generalized atrophy. Chronic microvascular ischemic change. No evidence for  acute infarction, hemorrhage, mass lesion, hydrocephalus, or extra-axial fluid. Vascular calcification. Calvarium intact. No sinus or mastoid air fluid levels. Similar appearance to priors.  IMPRESSION: No acute intracranial abnormality. Chronic changes as described, similar to priors.   Electronically Signed   By: Rolla Flatten M.D.   On: 08/30/2013 19:20   Dg Chest Portable 1 View  08/30/2013   CLINICAL DATA:  ALTERED MENTAL STATUS WEAKNESS  EXAM: PORTABLE CHEST - 1 VIEW  COMPARISON:  08/24/2013  FINDINGS: Comminuted left humerus fracture with dislocation of the glenohumeral joint.  Normal heart size.  Lungs hyper aerated and clear.  IMPRESSION: No active  cardiopulmonary disease.   Electronically Signed   By: Maryclare Bean M.D.   On: 08/30/2013 17:34   Dg Shoulder Left Port  08/30/2013   CLINICAL DATA:  Shoulder pain  EXAM: PORTABLE LEFT SHOULDER - 2+ VIEW  COMPARISON:  08/18/2013  FINDINGS: There is again noted comminuted fracture of the proximal left humerus with inferior dislocation. The overall appearance is stable from the prior exam. No new focal abnormality is seen.  IMPRESSION: No significant interval change from the previous study   Electronically Signed   By: Inez Catalina M.D.   On: 08/30/2013 22:04    Positive ROS: All other systems have been reviewed and were otherwise negative with the exception of those mentioned in the HPI and as above.  Labs cbc  Recent Labs  08/30/13 1700 08/30/13 1717  WBC 8.4  --   HGB 10.8* 11.6*  HCT 33.0* 34.0*  PLT 209  --     Labs inflam No results found for this basename: ESR, CRP,  in the last 72 hours  Labs coag No results found for this basename: INR, PT, PTT,  in the last 72 hours   Recent Labs  08/30/13 1700 08/30/13 1717  NA 138 137  K 4.5 4.3  CL 99 103  CO2 25  --   GLUCOSE 120* 117*  BUN 29* 28*  CREATININE 1.33* 1.30*  CALCIUM 9.6  --     Physical Exam: Filed Vitals:   08/30/13 2210  BP: 132/70  Pulse: 96  Temp: 97.3 F (36.3 C)  Resp: 14   General: Alert, no acute distress Cardiovascular: No pedal edema Respiratory: No cyanosis, no use of accessory musculature GI: No organomegaly, abdomen is soft and non-tender Skin: No lesions in the area of chief complaint Neurologic: Sensation intact distally Lymphatic: No axillary or cervical lymphadenopathy  MUSCULOSKELETAL:  LUE: She has significant pain with range of motion of her shoulder. She has swelling throughout the extremity. Distally she is able to wiggle her fingers she reports normal sensation to palpation there. She has good pulses radial and ulnar. Compartments are soft.  Other extremities are atraumatic  with painless ROM and NVI.  Assessment: Subacute Left shoulder fracture dislocation.   Plan: A lengthy discussion with the patient and her daughter today. I think that her shoulder does need to be reduced versus a reverse shoulder arthroplasty given the likely injury to her rotator cuff versus a resection of a rotator head. I discussed the risks and benefits of the the above procedures with the daughter I think that fixing her proximal humerus fracture would be a mistake is given her bone quality a fear that it would fall apart. The other alternatives are doing nothing versus a reverse shoulder versus humeral head resection after discussion of all these the daughter would like to go forward with a reverse shoulder as would  her the patient. Weight Bearing Status: sling  We will initiate the preop medical clearance.  Plan for Left Reverse total shoulder arthroplasty on 6/15  PT VTE px: SCD's and Chemical px per the primary team   Edmonia Lynch, D, MD Cell [phone]   08/31/2013 4:54 AM

## 2013-08-31 NOTE — Progress Notes (Signed)
Orthostatic vital signs performed Laying 175/71 Sitting 140/89 Standing 110/54

## 2013-08-31 NOTE — Progress Notes (Signed)
PROGRESS NOTE  Brandey Vandalen ACZ:[id number] DOB: 1926-09-29 DOA: 08/30/2013 PCP: Houston Siren, MD  Summary: 80 year old woman suffered a dislocation fracture left shoulder 5/21, not reduce at that time. Outpatient followup is planned. Patient chew with Norco and oral Dilaudid and afterwards became more lethargic and less active.  Assessment/Plan: 1. Subacute encephalopathy. Thought to be secondary to narcotics related to pain management of left shoulder injury. CT head no acute abnormality. Chest x-ray no acute disease. Urinalysis unremarkable. 2. Traumatic closed comminuted fracture left shoulder with dislocation. Will likely be difficult to manage with pain control this and the patient's history of encephalopathy related to narcotics. Await orthopedic evaluation. 3. Suspected acute renal failure, dehydration. Likely secondary to dehydration. 4. Orthostatic hypotension. But be secondary motion. 5. Atrial fibrillation rapid ventricular response was noted in the chart by emergency department RN, however EKG on admission so sinus rhythm. Currently on telemetry a regular rhythm plan check EKG to reassess rhythm. Asymptomatic.   Further recommendations for shoulder fracture and dislocation per orthopedics. Try to minimize narcotics if possible.  IV fluids, check orthostatics in the morning. Basic metabolic panel morning.  Bowel regimen  Discussed in detail with daughter at bedside  Code Status: full code DVT prophylaxis: Heparin Family Communication:  Disposition Plan: Pending  Murray Hodgkins, MD  [facility]  Pager [phone] If 7PM-7AM, please contact night-coverage at [email or web address], password [facility] 08/31/2013, 8:47 AM  LOS: 1 day   Consultants:  Orthopedics  Procedures:    Antibiotics:    HPI/Subjective: Continues to have left shoulder pain.  Objective: Filed Vitals:   08/30/13 2130 08/30/13 2148 08/30/13 2210 08/31/13 0519  BP: 136/58 136/58 132/70 131/67   Pulse: 96 98 96 81  Temp:   97.3 F (36.3 C) 97.3 F (36.3 C)  TempSrc:      Resp: 14 15 14 14   Height:   5\' 3"  (1.6 m)   Weight:   47.9 kg (105 lb 9.6 oz)   SpO2: 94% 94% 92% 95%    Intake/Output Summary (Last 24 hours) at 08/31/13 0847 Last data filed at 08/31/13 0646  Gross per 24 hour  Intake      0 ml  Output      2 ml  Net     -2 ml     Filed Weights   08/30/13 2210  Weight: 47.9 kg (105 lb 9.6 oz)    Exam:   Afebrile, vital signs stable. Hypoxia. Gen alert, nontoxic, appears chronically ill. Psych. Diffuse, follows simple commands. Cardiovascular. Regular rate and rhythm. No murmur, or gallop. No lower extremity edema. Respiratory. Clear to auscultation bilaterally. No wheezes, rales or rhonchi. Normal respiratory effort. Musculoskeletal. Moves all extremities to command. Left arm in sling, fingers appeared grossly normal with some edema.   Data Reviewed:  No labs this morning  CT of the head unremarkable. Left shoulder film without apparent change. Chest x-ray no acute disease.  Scheduled Meds: . beta carotene w/minerals  1 tablet Oral BID  . heparin  5,000 Units Subcutaneous 3 times per day  . LORazepam  0.5 mg Oral 3 times per day  . simvastatin  10 mg Oral q1800   Continuous Infusions: . sodium chloride 75 mL/hr at 08/30/13 2305    Principal Problem:   Traumatic closed displaced fracture of left shoulder with anterior dislocation Active Problems:   Delirium due to general medical condition   Fracture dislocation of left shoulder joint   Orthostatic hypotension   Encephalopathy   Acute renal failure  Time spent 25 minutes

## 2013-08-31 NOTE — Progress Notes (Signed)
Family concerned with ongoing pain and patients BP, probably related to pain. BP 171/80. MD Sarajane Jews notified and ordered Ultram to try vs narcotics at this time. Will continue to monitor for improved pain control and follow up with Dr Sarajane Jews if no improvement occurs. Will monitor BP.

## 2013-08-31 NOTE — Progress Notes (Signed)
PT Cancellation Note  Patient Details Name: Kerri Hurst MRN: [id number] DOB: [birth year]   Cancelled Treatment:    Reason Eval/Treat Not Completed: Patient not medically ready Attempted to evaluate patient this afternoon. Nurse tech present in room checking vitals- reports patient's HR 160 BPM. Nurse notified. Will follow up with pt in AM for evaluation.  [place], [place]  [name] 08/31/2013, 2:16 PM

## 2013-09-01 DIAGNOSIS — N179 Acute kidney failure, unspecified: Secondary | ICD-10-CM

## 2013-09-01 LAB — BASIC METABOLIC PANEL
BUN: 17 mg/dL (ref 6–23)
CO2: 23 mEq/L (ref 19–32)
Calcium: 8.5 mg/dL (ref 8.4–10.5)
Chloride: 104 mEq/L (ref 96–112)
Creatinine, Ser: 0.96 mg/dL (ref 0.50–1.10)
GFR calc Af Amer: 61 mL/min — ABNORMAL LOW (ref >90)
GFR, EST NON AFRICAN AMERICAN: 53 mL/min — AB (ref >90)
GLUCOSE: 109 mg/dL — AB (ref 70–99)
POTASSIUM: 4.2 meq/L (ref 3.7–5.3)
Sodium: 140 mEq/L (ref 137–147)

## 2013-09-01 MED ORDER — HYDROCODONE-ACETAMINOPHEN 5-325 MG PO TABS
1.0000 | ORAL_TABLET | Freq: Four times a day (QID) | ORAL | Status: DC | PRN
  Administered 2013-09-02 – 2013-09-04 (×5): 1 via ORAL
  Filled 2013-09-01 (×5): qty 1

## 2013-09-01 MED ORDER — IBUPROFEN 400 MG PO TABS
400.0000 mg | ORAL_TABLET | Freq: Three times a day (TID) | ORAL | Status: DC
  Administered 2013-09-01 – 2013-09-02 (×4): 400 mg via ORAL
  Filled 2013-09-01 (×7): qty 1

## 2013-09-01 MED ORDER — ONDANSETRON HCL 4 MG/2ML IJ SOLN
4.0000 mg | Freq: Four times a day (QID) | INTRAMUSCULAR | Status: DC | PRN
  Administered 2013-09-01 – 2013-09-04 (×2): 4 mg via INTRAVENOUS
  Filled 2013-09-01 (×2): qty 2

## 2013-09-01 MED ORDER — HYDROCODONE-ACETAMINOPHEN 5-325 MG PO TABS
1.0000 | ORAL_TABLET | Freq: Once | ORAL | Status: AC
  Administered 2013-09-01: 1 via ORAL
  Filled 2013-09-01: qty 1

## 2013-09-01 MED ORDER — PANTOPRAZOLE SODIUM 40 MG PO TBEC
40.0000 mg | DELAYED_RELEASE_TABLET | Freq: Every day | ORAL | Status: DC
  Administered 2013-09-01 – 2013-09-06 (×5): 40 mg via ORAL
  Filled 2013-09-01 (×5): qty 1

## 2013-09-01 NOTE — Progress Notes (Signed)
     Subjective:  Patient reports pain as moderate.  She is sitting up in bed, and eating, although has some difficulty with swallowing. There has apparently been more pain around the shoulder in the last couple of days.   The story according to the daughter was that 3 weeks ago she fell, cut her nose, had bleeding, went to the emergency room, had pain in the shoulder, x-rays were interpreted apparently as a "old fracture". She subsequently was seen and evaluated for possible atrial fibrillation, which apparently was not the case, referred to an orthopedist in [place], who diagnosed her with a dislocation and fracture of the shoulder, and ultimately she was brought here for management.  The daughter reports that she was able to wiggle her fingers yesterday, and thinks that her thumb was working.  Objective:   VITALS:   Filed Vitals:   08/31/13 1056 08/31/13 1300 08/31/13 2045 09/01/13 0610  BP: 110/54 171/83 150/75 160/70  Pulse:  159 87 84  Temp:  97 F (36.1 C) 98.3 F (36.8 C) 98.2 F (36.8 C)  TempSrc:   Oral Oral  Resp:  20 18 18   Height:      Weight:      SpO2:  97% 97% 98%   The patient is somewhat lethargic, and barely follows commands.  She has substantial soft tissue swelling around the left arm, and does liquor her fingers with flexion, although the motor function of the ulnar nerve is difficult to appreciate, but she does extend at her IP joints of the fingers, although not as much at the MCP joints. I cannot get her to extend her thumb, or her wrist. Sensation is difficult to test, but seems intact over the deltoid region, although is not completely normal throughout the hand. The distribution is difficult to appreciate, due to her mental status and function. Her compartments are soft throughout the forearm.  Lab Results  Component Value Date   WBC 8.4 08/30/2013   HGB 11.6* 08/30/2013   HCT 34.0* 08/30/2013   MCV 94.3 08/30/2013   PLT 209 08/30/2013      Assessment/Plan:     Principal Problem:   Traumatic closed displaced fracture of left shoulder with anterior dislocation Active Problems:   Delirium due to general medical condition   Fracture dislocation of left shoulder joint   Orthostatic hypotension   Encephalopathy   Acute renal failure  I suspect that she has a subacute radial nerve palsy, and the exact time course of the dislocation is difficult to determine, however I suspect that she has been dislocated since her fall on May 21, and that the initial x-ray did not reveal this, as there was no axial view performed.  She is currently undergoing medical optimization, and has a fluctuating mental status, and is extremely high risk of both for morbidity and mortality when giving consideration to surgical intervention. I have discussed this with the daughter.  I have spoken with Dr. Percell Miller, and updated him on her neurologic examination, who indicated that he recommended staying the course and planning for surgical intervention on Monday. This would allow for medical optimization, and a more clear evaluation of her preoperative neurologic function, as her mental status improves.   Chip Canepa P 09/01/2013, 8:25 AM Cell [phone]

## 2013-09-01 NOTE — Evaluation (Signed)
Clinical/Bedside Swallow Evaluation Patient Details  Name: Kerri Hurst MRN: [id number] Date of Birth: [birth year]  Today's Date: 09/01/2013 Time: 0200-0235 SLP Time Calculation (min): 35 min  Past Medical History:  Past Medical History  Diagnosis Date  . Arthritis    Past Surgical History:  Past Surgical History  Procedure Laterality Date  . Abdominal hysterectomy    . Cholecystectomy     HPI:  Pt is an 80 yo female admitted with decreased activity and decreased po intake; initial difficulty started after a fall she sustained on 08/09/13 fracturing her left shoulder and nose with multiple facial bruises; daughter and pt state "feeling of food sticking in chest." and nausea with decreased po intake   Assessment / Plan / Recommendation Clinical Impression  Pt without frank s/s of aspiration noted during BSE; oropharyngeal swallow appeared adequate with exception of multiple swallows noted, which may be d/t possible reflux symptoms noted during evaluation including belching and globus sensation; daughter stated pt took Nexium in past, but it caused side effects and she takes Pepcid occasionally, but no consistent reflux medications taken at present time; recommend ST f/u x1 to assess diet tolerance over the course of a meal; MD may want to consider reflux medications to treat esophageal symptoms if agrees with SLP findings    Aspiration Risk  Mild    Diet Recommendation Regular;Thin liquid (as tolerated)   Liquid Administration via: Cup;Straw Medication Administration: Whole meds with puree Supervision: Patient able to self feed Compensations: Slow rate;Small sips/bites Postural Changes and/or Swallow Maneuvers: Seated upright 90 degrees;Upright 30-60 min after meal    Other  Recommendations Recommended Consults: Consider esophageal assessment;Consider GI evaluation Oral Care Recommendations: Oral care BID   Follow Up Recommendations   (ST f/u x1 for diet tolerance)     Frequency and Duration min 1 x/week  1 week   Pertinent Vitals/Pain Elevated BP intermittently; afebrile    SLP Swallow Goals  See POC   Swallow Study Prior Functional Status   living at home with daughter after fall on 08/09/13    General Date of Onset: 08/30/13 HPI: Pt is an 80 yo female admitted with decreased activity and decreased po intake; initial difficulty started after a fall she sustained on 08/09/13 fracturing her left shoulder and nose with multiple facial bruises; daughter and pt state "feeling of food sticking in chest." and nausea with decreased po intake Type of Study: Bedside swallow evaluation Diet Prior to this Study: Regular;Thin liquids Temperature Spikes Noted: No Respiratory Status: Room air History of Recent Intubation: No Behavior/Cognition: Alert;Cooperative Oral Cavity - Dentition: Missing dentition (molars on bottom; loose-fitting partials) Self-Feeding Abilities: Able to feed self Patient Positioning: Upright in bed Baseline Vocal Quality: Clear;Low vocal intensity Volitional Cough: Strong Volitional Swallow: Able to elicit    Oral/Motor/Sensory Function Overall Oral Motor/Sensory Function: Appears within functional limits for tasks assessed Labial ROM: Within Functional Limits Labial Symmetry: Within Functional Limits Labial Strength: Within Functional Limits Labial Sensation: Within Functional Limits Lingual ROM: Within Functional Limits Lingual Symmetry: Within Functional Limits Lingual Strength: Within Functional Limits Lingual Sensation: Within Functional Limits Facial ROM: Within Functional Limits Facial Symmetry: Within Functional Limits Facial Strength: Within Functional Limits Facial Sensation: Within Functional Limits Velum: Within Functional Limits Mandible: Within Functional Limits   Ice Chips Ice chips: Not tested   Thin Liquid Thin Liquid: Within functional limits Presentation: Cup;Straw    Nectar Thick Nectar Thick Liquid:  Not tested   Honey Thick Honey Thick Liquid: Not tested   Puree  Puree: Impaired Presentation: Self Fed Pharyngeal Phase Impairments: Multiple swallows;Other (comments) (globus sensation)   Solid       Solid: Impaired Presentation: Self Fed Oral Phase Impairments: Impaired mastication (secondary to missing dentition) Pharyngeal Phase Impairments: Multiple swallows       Rehaan Viloria,PAT, M.S., CCC-SLP 09/01/2013,4:08 PM

## 2013-09-01 NOTE — Progress Notes (Signed)
Occupational Therapy Treatment Patient Details Name: Kerri Hurst MRN: [id number] DOB: January 04, 1927 Today's Date: 09/01/2013    History of present illness Kerri Hurst is an 80 y.o. Female admitted 08/30/13 with decreased activity, PO intake, and inability to get out of bed. Symptoms have been progressively worse since fall on 08/09/13, during which she sustained a Lt shoulder fx that was not diagnosed at initial ED visit. At this time, pt is scheduled for shoulder surgery on 09/03/13.   OT comments  Pt seen today for ADLs and therapeutic exercise. Pt limited by pain and lethargy and is highly guarded with LUE movement making it difficult to perform ROM exercises. Pt participated in wrist and hand exercises and tolerated gentle PROM of elbow.   Follow Up Recommendations  SNF    Equipment Recommendations  Other (comment) (TBD by next venue)       Precautions / Restrictions Precautions Precautions: Fall;Shoulder Precaution Comments: pt with edema of LUE and sensitive skin. To be operated on 09/03/13 Restrictions Weight Bearing Restrictions: Yes LUE Weight Bearing: Non weight bearing       Mobility Bed Mobility Overal bed mobility: Needs Assistance Bed Mobility: Rolling Rolling: Max assist                             ADL                                         General ADL Comments: Pt continues to be total A for all ADLs due to lethargy and decreased use of LUE. Pt with high pain level and moans softly with PROM.                 Cognition  Alertness/Arousal: Lethargic Behavior During Therapy: Flat affect Overall Cognitive Status: Impaired/Different from baseline Area of Impairment: Safety/judgement;Following commands        Following Commands: Follows one step commands inconsistently Safety/Judgement: Decreased awareness of safety;Decreased awareness of deficits            Exercises General Exercises - Upper Extremity Elbow  Flexion: PROM;Left;Supine;10 reps Elbow Extension: PROM;Left;10 reps;Supine (pt limited in extension due to childhood injury and increase) Wrist Flexion: AAROM;Left;10 reps;Supine Wrist Extension: AAROM;Left;10 reps;Supine Digit Composite Flexion: AAROM;Left;10 reps;Supine Composite Extension: AAROM;Left;10 reps;Supine           Pertinent Vitals/ Pain       Pt unable to report pain, however grimaces and closes eyes with ROM of LUE, particularly elbow extension. Gentle retrograde massage performed on LUE to manage edema and elevated L hand on towels.          Frequency Min 3X/week     Progress Toward Goals  OT Goals(current goals can now be found in the care plan section)  Progress towards OT goals: Not progressing toward goals - comment (pt limited by fatigue and pain; surgery scheduled for 6/15/1)     Plan Discharge plan remains appropriate       End of Session    Activity Tolerance Patient limited by lethargy;Patient limited by pain   Patient Left in bed;with call bell/phone within reach;with nursing/sitter in room;Other (comment) (MD discussing with pt/daughter)           Time: 2595-6387 OT Time Calculation (min): 27 min  Charges: OT General Charges $OT Visit: 1 Procedure OT Treatments $Self Care/Home Management : 8-22 mins $Therapeutic  Exercise: 8-22 mins  Juluis Rainier [phone] 09/01/2013, 10:16 AM

## 2013-09-01 NOTE — Progress Notes (Signed)
Paged Dr. Sarajane Jews regarding patient's positive orthostatic VS. Daughter states pt is always positive at Dr.'s office. No new orders at this time. Continue fluids.

## 2013-09-01 NOTE — Progress Notes (Signed)
PT Cancellation Note  Patient Details Name: Kerri Hurst MRN: [id number] DOB: 1927/01/24   Cancelled Treatment:    Reason Eval/Treat Not Completed: Patient declined, no reason specified  Attempted to evaluate pt today. Daughter states pt has been very lethargic and is concerned about her aspirating when she eats. Nurse notified. Pt alert enough to decline PT evaluation today. Shakes her head in agreement for evaluation tomorrow. Will follow up as pt is able.  [place], [place]  [name] 09/01/2013, 2:21 PM

## 2013-09-01 NOTE — Progress Notes (Signed)
PROGRESS NOTE  Kerri Hurst KPT:[id number] DOB: Aug 06, 1926 DOA: 08/30/2013 PCP: Houston Siren, MD  Summary: 80 year old woman suffered a dislocation fracture left shoulder 5/21, not reduce at that time. Outpatient followup is planned. Patient chew with Norco and oral Dilaudid and afterwards became more lethargic and less active.  Assessment/Plan: 1. Subacute encephalopathy. Secondary to narcotics related to pain management of left shoulder injury. CT head no acute abnormality. Chest x-ray no acute disease. Urinalysis unremarkable. Remains afebrile. 2. Traumatic closed comminuted fracture left shoulder with dislocation. Operative intervention planned. 3. Suspected acute renal failure, dehydration. Likely secondary to dehydration. Continue IV fluids. 4. Orthostatic hypotension secondary to dehydration. 5. Atrial fibrillation rapid ventricular response was noted in the chart by emergency department RN, however EKG on admission and repeat EKG 6/12 showed sinus rhythm.    Try to minimize narcotics if possible, goal adequate pain control without oversedation or inordinate confusion. Discussed goals with daughter at bedside.   Operative intervention in shoulder 6/15. Discussed in detail with daughter, patient has no history of cardiac disease, chest pain, shortness of breath or stroke. Main risk factors would be age, debility and nutritional status. EKGs have showed sinus rhythm with premature supraventricular complexes, anteroseptal MI, age and known, no previous studies. In the last 2 weeks she has had a heart monitor to exclude arrhythmia, per daughter cardiologist saw no evidence of atrial fibrillation. Would recommend proceeding to surgery without further evaluation with exception of 2d echo.   IV fluids, check orthostatics in the morning. Basic metabolic panel morning.  Bowel regimen  Discussed risk/benefit with daughter, she is agreeable to surgery and understand risks including stroke,  MI and death. Patient has failed conservative management.  Code Status: full code DVT prophylaxis: Heparin Family Communication:  Disposition Plan: Pending  Murray Hodgkins, MD  [facility]  Pager [phone] If 7PM-7AM, please contact night-coverage at [email or web address], password [facility] 09/01/2013, 9:30 AM  LOS: 2 days   Consultants:  Orthopedics  Procedures:    Antibiotics:    HPI/Subjective: Had pain overnight and poor sleep but more awake today. Still has pain in her left shoulder primarily.  Objective: Filed Vitals:   08/31/13 1056 08/31/13 1300 08/31/13 2045 09/01/13 0610  BP: 110/54 171/83 150/75 160/70  Pulse:  159 87 84  Temp:  97 F (36.1 C) 98.3 F (36.8 C) 98.2 F (36.8 C)  TempSrc:   Oral Oral  Resp:  20 18 18   Height:      Weight:      SpO2:  97% 97% 98%    Intake/Output Summary (Last 24 hours) at 09/01/13 0930 Last data filed at 09/01/13 0843  Gross per 24 hour  Intake      0 ml  Output      7 ml  Net     -7 ml     Filed Weights   08/30/13 2210  Weight: 47.9 kg (105 lb 9.6 oz)    Exam:   Afebrile, vital signs stable. Hypoxia. Gen. Appears calm and mildly uncomfortable. Nontoxic. Psych. Awake, alert, follows commands. Confused, disoriented. Cardiovascular. Regular rate and rhythm. No murmur, rub or gallop. No lower extremity edema. Chest appears unremarkable. Respiratory. Clear to auscultation bilaterally. No wheezes, rales or rhonchi. Normal respiratory effort. Abdomen. Soft. Musculoskeletal. No change in left hand edema. Moves all extremities to command Neurologic. Grossly nonfocal.   Data Reviewed:  EKG sinus rhythm with supraventricular complexes, no acute changes. Septal infarct, age unknown.  Scheduled Meds: . acetaminophen  650 mg Oral Q6H  .  beta carotene w/minerals  1 tablet Oral BID  . heparin  5,000 Units Subcutaneous 3 times per day  . simvastatin  10 mg Oral q1800   Continuous Infusions: . sodium chloride       Principal Problem:   Traumatic closed displaced fracture of left shoulder with anterior dislocation Active Problems:   Delirium due to general medical condition   Fracture dislocation of left shoulder joint   Orthostatic hypotension   Encephalopathy   Acute renal failure   Time spent 20 minutes

## 2013-09-01 NOTE — Progress Notes (Addendum)
Patient refusing echo because daughter states pt had it done recently at [facility]. Dr. Curly Rim.

## 2013-09-01 NOTE — Progress Notes (Signed)
Attempted echo.  Patient's daughter stated it was done 3 weeks ago at [facility] and she did not want it repeated, as insurance would not cover it being repeated.  The daughter asked the the medical records be obtained from [facility].   RN notified. [place]er [place], RDCS

## 2013-09-02 DIAGNOSIS — G9340 Encephalopathy, unspecified: Secondary | ICD-10-CM

## 2013-09-02 LAB — BASIC METABOLIC PANEL
BUN: 15 mg/dL (ref 6–23)
CHLORIDE: 103 meq/L (ref 96–112)
CO2: 23 meq/L (ref 19–32)
CREATININE: 1.09 mg/dL (ref 0.50–1.10)
Calcium: 9.1 mg/dL (ref 8.4–10.5)
GFR calc non Af Amer: 45 mL/min — ABNORMAL LOW (ref >90)
GFR, EST AFRICAN AMERICAN: 52 mL/min — AB (ref >90)
Glucose, Bld: 97 mg/dL (ref 70–99)
POTASSIUM: 3.9 meq/L (ref 3.7–5.3)
Sodium: 141 mEq/L (ref 137–147)

## 2013-09-02 LAB — TSH: TSH: 2.49 u[IU]/mL (ref 0.350–4.500)

## 2013-09-02 MED ORDER — PNEUMOCOCCAL VAC POLYVALENT 25 MCG/0.5ML IJ INJ
0.5000 mL | INJECTION | INTRAMUSCULAR | Status: DC
  Filled 2013-09-02: qty 0.5

## 2013-09-02 MED ORDER — IBUPROFEN 200 MG PO TABS
200.0000 mg | ORAL_TABLET | Freq: Three times a day (TID) | ORAL | Status: DC
  Administered 2013-09-02 (×2): 200 mg via ORAL
  Filled 2013-09-02 (×8): qty 1

## 2013-09-02 NOTE — Progress Notes (Signed)
PROGRESS NOTE  Kerri Hurst BSJ:[id number] DOB: 16-Oct-1926 DOA: 08/30/2013 PCP: Houston Siren, MD  Summary: 80 year old woman suffered a dislocation fracture left shoulder 5/21, not reduce at that time. Outpatient followup is planned. Patient chew with Norco and oral Dilaudid and afterwards became more lethargic and less active.  Assessment/Plan: 1. Traumatic closed comminuted fracture left shoulder with dislocation. Operative intervention planned. 2. Acute renal failure, dehydration. Resp;ved. Likely secondary to dehydration.  3. Subacute encephalopathy. Stable. Secondary to narcotics related to pain management of left shoulder injury. CT head no acute abnormality. Chest x-ray no acute disease. Urinalysis unremarkable. Remains afebrile. 4. Orthostatic hypotension, appears to be a subacute issues (see neurology consultation at [facility] in care everywhere). 5. Atrial fibrillation rapid ventricular response was noted in the chart by emergency department RN, however EKG on admission and repeat EKG 6/12 showed sinus rhythm.  6. Globus sensation. Change to soft diet. H/o GI issues in past, long time since dilatation. Recommend outpatient GI follow-up.   Continue pain control. Surgery planned 6/15.  SL IV  Refused echocardiogram, reports recently had one, but not found in care everywhere. Will discuss with her cardiologist Dr. Ricky Ala at [facility] 6/15 AM for any further recommendations.  Code Status: full code DVT prophylaxis: Heparin Family Communication: discussed with daughter at bedside Disposition Plan: Pending  Murray Hodgkins, MD  [facility]  Pager [phone] If 7PM-7AM, please contact night-coverage at [email or web address], password [facility] 09/02/2013, 12:58 PM  LOS: 3 days   Consultants:  Orthopedics   Occupational therapy. Skilled nursing facility  Speech therapy. Regular diet, thin liquids.  Procedures:    Antibiotics:    HPI/Subjective: Overall  feeling somewhat better. 4 gout seems to be effective for pain.  Objective: Filed Vitals:   09/01/13 1106 09/01/13 2143 09/02/13 0511 09/02/13 1028  BP: 149/52 161/73 171/86 158/72  Pulse: 84 88 89 84  Temp:  99 F (37.2 C) 98.2 F (36.8 C) 98.1 F (36.7 C)  TempSrc:  Oral Oral Oral  Resp: 18 24 20 10   Height:      Weight:      SpO2:  100% 97% 97%    Intake/Output Summary (Last 24 hours) at 09/02/13 1258 Last data filed at 09/02/13 1005  Gross per 24 hour  Intake 1127.5 ml  Output      6 ml  Net 1121.5 ml     Filed Weights   08/30/13 2210  Weight: 47.9 kg (105 lb 9.6 oz)    Exam:   Afebrile, vital signs stable. Hypoxia. Gen. Appears calm, comfortable. Psych. Alert, follows commands. Mildly confused. Cardiovascular. Regular rate and rhythm with premature beats. No murmur gallop. No lower extremity. Respiratory. Clear to auscultation bilaterally. No wheezes, rales or rhonchi. Normal respiratory effort.   Data Reviewed: I/O: Multiple voids, BM x1 Chemistry: Basic metabolic panel unremarkable. TSH normal.  Scheduled Meds: . beta carotene w/minerals  1 tablet Oral BID  . heparin  5,000 Units Subcutaneous 3 times per day  . ibuprofen  200 mg Oral TID  . pantoprazole  40 mg Oral Daily  . [START ON 09/03/2013] pneumococcal 23 valent vaccine  0.5 mL Intramuscular Tomorrow-1000  . simvastatin  10 mg Oral q1800   Continuous Infusions: . sodium chloride 50 mL/hr at 09/02/13 1214    Principal Problem:   Traumatic closed displaced fracture of left shoulder with anterior dislocation Active Problems:   Delirium due to general medical condition   Fracture dislocation of left shoulder joint   Orthostatic hypotension   Encephalopathy  Acute renal failure   Time spent 20 minutes

## 2013-09-02 NOTE — Evaluation (Signed)
Physical Therapy Evaluation Patient Details Name: Kerri Hurst MRN: [id number] DOB: 1926-04-22 Today's Date: 09/02/2013   History of Present Illness  Kerri Hurst is an 80 y.o. Female admitted 08/30/13 with decreased activity, PO intake, and inability to get out of bed. Symptoms have been progressively worse since fall on 08/09/13, during which she sustained a Lt shoulder fx that was not diagnosed at initial ED visit. At this time, pt is scheduled for shoulder surgery on 09/03/13.  Clinical Impression  Pt admitted with the above, scheduled for surgery 09/03/13 ( left reveres shoulder arthroplasty.) Pt currently with functional limitations due to the deficits listed below (see PT Problem List). Pt will benefit from skilled PT to increase their independence and safety with mobility to allow discharge to the venue listed below.       Follow Up Recommendations SNF;Supervision/Assistance - 24 hour    Equipment Recommendations  3in1 (PT)    Recommendations for Other Services       Precautions / Restrictions Precautions Precautions: Fall;Shoulder Restrictions Weight Bearing Restrictions: Yes LUE Weight Bearing: Non weight bearing      Mobility  Bed Mobility Overal bed mobility: Needs Assistance;+2 for physical assistance Bed Mobility: Supine to Sit Rolling: Max assist;+2 for physical assistance   Supine to sit: Max assist     General bed mobility comments: Max assist for LE support off of bed. Pt has difficulty following directions consistently. Second person to assist with trunk control. Able to sit without support once at edge of bed.  Transfers Overall transfer level: Needs assistance Equipment used: Rolling walker (2 wheeled) Transfers: Sit to/from [facility] Sit to Stand: Mod assist;+2 physical assistance Stand pivot transfers: Mod assist;+2 safety/equipment       General transfer comment: Second person assisted slightly, VCs to lean forward, mod  assist for boost to stand. Significantly leaning posteriorly. Max cues to lean forward but unable to maintain. Pivot transfer pt able to take small steps to turn but continues to lean posteriorly. Poor control with sitting and VCs for hand placement.  Ambulation/Gait                Stairs            Wheelchair Mobility    Modified Rankin (Stroke Patients Only)       Balance Overall balance assessment: Needs assistance;History of Falls Sitting-balance support: Feet supported;Single extremity supported Sitting balance-Leahy Scale: Poor   Postural control: Posterior lean Standing balance support: Single extremity supported Standing balance-Leahy Scale: Zero Standing balance comment: Knees locked in extension with posterior lean. Fear of falling.                             Pertinent Vitals/Pain Pt reports pain in right shoulder throughout therapy session Daughter reports pain medication given to pt just prior to PT eval. Patient repositioned in chair for comfort.     Home Living Family/patient expects to be discharged to:: Skilled nursing facility Living Arrangements: Children Available Help at Discharge: Family Type of Home: Apartment Home Access: Stairs to enter Entrance Stairs-Rails: None Entrance Stairs-Number of Steps: 2 Home Layout: One level Home Equipment: [place] - 4 wheels;Shower seat      Prior Function Level of Independence: Needs assistance   Gait / Transfers Assistance Needed: Intermittent use of rollator  ADL's / Homemaking Assistance Needed: Needs assistance with bath/dress        Hand Dominance   Dominant Hand: Right  Extremity/Trunk Assessment   Upper Extremity Assessment: Defer to OT evaluation           Lower Extremity Assessment: Generalized weakness         Communication   Communication: No difficulties  Cognition Arousal/Alertness: Lethargic Behavior During Therapy: WFL for tasks  assessed/performed Overall Cognitive Status: Impaired/Different from baseline Area of Impairment: Orientation;Memory Orientation Level: Disoriented to;Place;Time;Situation   Memory: Decreased short-term memory              General Comments General comments (skin integrity, edema, etc.): Continues to have significant edema in LUE. General weakness in LEs bilaterally. Complains of left shoulder pain throughout therapy session.    Exercises General Exercises - Lower Extremity Ankle Circles/Pumps: AAROM;Both;10 reps;Supine Long Arc Quad: AROM;Both;10 reps;Seated Hip Flexion/Marching: AROM;Both;10 reps;Seated      Assessment/Plan    PT Assessment Patient needs continued PT services  PT Diagnosis Difficulty walking;Abnormality of gait;Generalized weakness;Acute pain   PT Problem List Decreased strength;Decreased range of motion;Decreased activity tolerance;Decreased balance;Decreased mobility;Decreased cognition;Decreased knowledge of use of DME;Decreased safety awareness;Decreased knowledge of precautions;Pain  PT Treatment Interventions DME instruction;Gait training;Functional mobility training;Therapeutic activities;Therapeutic exercise;Balance training;Neuromuscular re-education;Cognitive remediation;Patient/family education;Modalities   PT Goals (Current goals can be found in the Care Plan section) Acute Rehab PT Goals Patient Stated Goal: Did not state PT Goal Formulation: With patient/family Time For Goal Achievement: 09/09/13 Potential to Achieve Goals: Fair    Frequency Min 3X/week   Barriers to discharge Inaccessible home environment;Decreased caregiver support Pt at too low of a level to function safely at home, even with family support.    Co-evaluation               End of Session   Activity Tolerance: Patient limited by pain Patient left: in chair;with call bell/phone within reach;with family/visitor present Nurse Communication: Mobility status          Time: 1132-1203 PT Time Calculation (min): 31 min   Charges:   PT Evaluation $Initial PT Evaluation Tier I: 1 Procedure PT Treatments $Therapeutic Activity: 8-22 mins   PT G Codes:        Elayne Snare, [place]   [name] 09/02/2013, 1:42 PM

## 2013-09-02 NOTE — Progress Notes (Signed)
     Subjective:  Patient reports pain as mild.  No new complaints. She is mentally much more functional today, and interacts with me essentially normally.  Objective:   VITALS:   Filed Vitals:   09/01/13 1103 09/01/13 1106 09/01/13 2143 09/02/13 0511  BP: 157/83 149/52 161/73 171/86  Pulse: 86 84 88 89  Temp:   99 F (37.2 C) 98.2 F (36.8 C)  TempSrc:   Oral Oral  Resp: 18 18 24 20   Height:      Weight:      SpO2:   100% 97%    She has intact radial pulse on the left side. Still with a fair amount of soft tissue swelling, but no evidence for compartment syndrome. She has finger flexion intact, she subjectively says that she has sensation intact throughout the hand and all neurologic distributions, but she has absent wrist extension and thumb absent extension, and I do think her ulnar nerve motor function is intact although it is weak. She cannot cross her fingers, cannot abduct her fingers, although her thumb adductors seem to be firing.  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 She is going to plan for a left shoulder reverse arthroplasty by Dr. Percell Miller tomorrow after. She will be n.p.o. after midnight tonight, Dr. Percell Miller to resume tomorrow.  Yiselle Babich P 09/02/2013, 6:34 AM   Marchia Bond, MD Cell [phone]

## 2013-09-02 NOTE — Progress Notes (Signed)
Clinical Education officer, museum (CSW) received call from RN stating that patient's daughter is interested in [place] ([place] paper work. CSW gave daughter HPOA packet and encouraged her to go over it today. CSW will continue to follow and assist as needed.   Blima Rich, [place] CSW [phone]

## 2013-09-03 ENCOUNTER — Inpatient Hospital Stay (HOSPITAL_COMMUNITY): Payer: Medicare Other | Admitting: Anesthesiology

## 2013-09-03 ENCOUNTER — Encounter (HOSPITAL_COMMUNITY)
Admission: EM | Disposition: A | Discharge status: Skilled Nursing Facility | Payer: Self-pay | Source: Home / Self Care | Attending: Family Medicine

## 2013-09-03 ENCOUNTER — Inpatient Hospital Stay (HOSPITAL_COMMUNITY): Payer: Medicare Other

## 2013-09-03 ENCOUNTER — Encounter (HOSPITAL_COMMUNITY): Payer: Self-pay | Admitting: General Practice

## 2013-09-03 ENCOUNTER — Encounter (HOSPITAL_COMMUNITY): Payer: Medicare Other | Admitting: Anesthesiology

## 2013-09-03 HISTORY — PX: REVERSE SHOULDER ARTHROPLASTY: SHX5054

## 2013-09-03 LAB — BASIC METABOLIC PANEL
BUN: 14 mg/dL (ref 6–23)
CO2: 22 mEq/L (ref 19–32)
CREATININE: 1.16 mg/dL — AB (ref 0.50–1.10)
Calcium: 9 mg/dL (ref 8.4–10.5)
Chloride: 107 mEq/L (ref 96–112)
GFR calc non Af Amer: 42 mL/min — ABNORMAL LOW (ref >90)
GFR, EST AFRICAN AMERICAN: 49 mL/min — AB (ref >90)
Glucose, Bld: 97 mg/dL (ref 70–99)
Potassium: 3.7 mEq/L (ref 3.7–5.3)
Sodium: 142 mEq/L (ref 137–147)

## 2013-09-03 LAB — MRSA PCR SCREENING: MRSA by PCR: NEGATIVE

## 2013-09-03 SURGERY — ARTHROPLASTY, SHOULDER, TOTAL, REVERSE
Anesthesia: General | Site: Shoulder | Laterality: Left

## 2013-09-03 MED ORDER — DEXAMETHASONE SODIUM PHOSPHATE 4 MG/ML IJ SOLN
INTRAMUSCULAR | Status: AC
  Filled 2013-09-03: qty 1

## 2013-09-03 MED ORDER — PROPOFOL 10 MG/ML IV BOLUS
INTRAVENOUS | Status: DC | PRN
  Administered 2013-09-03: 100 mg via INTRAVENOUS

## 2013-09-03 MED ORDER — ROCURONIUM BROMIDE 50 MG/5ML IV SOLN
INTRAVENOUS | Status: AC
  Filled 2013-09-03: qty 1

## 2013-09-03 MED ORDER — ENOXAPARIN SODIUM 30 MG/0.3ML ~~LOC~~ SOLN
30.0000 mg | Freq: Every day | SUBCUTANEOUS | Status: DC
  Administered 2013-09-04 – 2013-09-06 (×3): 30 mg via SUBCUTANEOUS
  Filled 2013-09-03 (×3): qty 0.3

## 2013-09-03 MED ORDER — FENTANYL CITRATE 0.05 MG/ML IJ SOLN
INTRAMUSCULAR | Status: AC
  Filled 2013-09-03: qty 5

## 2013-09-03 MED ORDER — FENTANYL CITRATE 0.05 MG/ML IJ SOLN
INTRAMUSCULAR | Status: DC | PRN
  Administered 2013-09-03: 75 ug via INTRAVENOUS
  Administered 2013-09-03: 50 ug via INTRAVENOUS
  Administered 2013-09-03: 25 ug via INTRAVENOUS
  Administered 2013-09-03: 50 ug via INTRAVENOUS
  Administered 2013-09-03 (×2): 25 ug via INTRAVENOUS
  Administered 2013-09-03: 50 ug via INTRAVENOUS

## 2013-09-03 MED ORDER — LIDOCAINE HCL (CARDIAC) 20 MG/ML IV SOLN
INTRAVENOUS | Status: DC | PRN
  Administered 2013-09-03: 20 mg via INTRAVENOUS

## 2013-09-03 MED ORDER — NALOXONE HCL 0.4 MG/ML IJ SOLN
INTRAMUSCULAR | Status: DC | PRN
  Administered 2013-09-03: 0.1 mg via INTRAVENOUS

## 2013-09-03 MED ORDER — ONDANSETRON HCL 4 MG/2ML IJ SOLN
INTRAMUSCULAR | Status: AC
  Filled 2013-09-03: qty 2

## 2013-09-03 MED ORDER — BUPIVACAINE HCL (PF) 0.5 % IJ SOLN
INTRAMUSCULAR | Status: AC
  Filled 2013-09-03: qty 10

## 2013-09-03 MED ORDER — BUPIVACAINE HCL (PF) 0.5 % IJ SOLN
INTRAMUSCULAR | Status: DC | PRN
  Administered 2013-09-03: 10 mL

## 2013-09-03 MED ORDER — LORAZEPAM 0.5 MG PO TABS
0.5000 mg | ORAL_TABLET | Freq: Three times a day (TID) | ORAL | Status: DC | PRN
  Administered 2013-09-03: 0.5 mg via ORAL
  Filled 2013-09-03: qty 1

## 2013-09-03 MED ORDER — NALOXONE HCL 0.4 MG/ML IJ SOLN
INTRAMUSCULAR | Status: AC
  Filled 2013-09-03: qty 1

## 2013-09-03 MED ORDER — GLYCOPYRROLATE 0.2 MG/ML IJ SOLN
INTRAMUSCULAR | Status: AC
  Filled 2013-09-03: qty 2

## 2013-09-03 MED ORDER — LACTATED RINGERS IV SOLN
INTRAVENOUS | Status: DC | PRN
  Administered 2013-09-03 (×2): via INTRAVENOUS

## 2013-09-03 MED ORDER — TRANEXAMIC ACID 100 MG/ML IV SOLN
1000.0000 mg | INTRAVENOUS | Status: DC
  Filled 2013-09-03: qty 10

## 2013-09-03 MED ORDER — DEXTROSE 5 % IV SOLN
10.0000 mg | INTRAVENOUS | Status: DC | PRN
  Administered 2013-09-03: 20 ug/min via INTRAVENOUS

## 2013-09-03 MED ORDER — NEOSTIGMINE METHYLSULFATE 10 MG/10ML IV SOLN
INTRAVENOUS | Status: DC | PRN
  Administered 2013-09-03: 3 mg via INTRAVENOUS

## 2013-09-03 MED ORDER — SODIUM CHLORIDE 0.9 % IR SOLN
Status: DC | PRN
  Administered 2013-09-03: 1000 mL

## 2013-09-03 MED ORDER — OXYCODONE HCL 5 MG/5ML PO SOLN: 5.0000 mg | Freq: Once | ORAL | Status: DC | PRN

## 2013-09-03 MED ORDER — TRANEXAMIC ACID 100 MG/ML IV SOLN
1000.0000 mg | INTRAVENOUS | Status: DC | PRN
  Administered 2013-09-03: 1000 mg via INTRAVENOUS

## 2013-09-03 MED ORDER — HYDROMORPHONE HCL PF 1 MG/ML IJ SOLN
0.2500 mg | INTRAMUSCULAR | Status: DC | PRN
  Administered 2013-09-03 (×2): 0.25 mg via INTRAVENOUS
  Administered 2013-09-03: 0.5 mg via INTRAVENOUS

## 2013-09-03 MED ORDER — NEOSTIGMINE METHYLSULFATE 10 MG/10ML IV SOLN
INTRAVENOUS | Status: AC
  Filled 2013-09-03: qty 1

## 2013-09-03 MED ORDER — ROCURONIUM BROMIDE 100 MG/10ML IV SOLN
INTRAVENOUS | Status: DC | PRN
  Administered 2013-09-03: 30 mg via INTRAVENOUS

## 2013-09-03 MED ORDER — OXYCODONE HCL 5 MG PO TABS: 5.0000 mg | ORAL_TABLET | Freq: Once | ORAL | Status: DC | PRN

## 2013-09-03 MED ORDER — DEXTROSE-NACL 5-0.45 % IV SOLN
INTRAVENOUS | Status: AC
  Administered 2013-09-04: 01:00:00 via INTRAVENOUS

## 2013-09-03 MED ORDER — FENTANYL CITRATE 0.05 MG/ML IJ SOLN
50.0000 ug | INTRAMUSCULAR | Status: DC | PRN
  Administered 2013-09-03 (×2): 50 ug via INTRAVENOUS

## 2013-09-03 MED ORDER — ONDANSETRON HCL 4 MG/2ML IJ SOLN
INTRAMUSCULAR | Status: DC | PRN
  Administered 2013-09-03: 4 mg via INTRAVENOUS

## 2013-09-03 MED ORDER — GLYCOPYRROLATE 0.2 MG/ML IJ SOLN
INTRAMUSCULAR | Status: DC | PRN
  Administered 2013-09-03: 0.4 mg via INTRAVENOUS

## 2013-09-03 MED ORDER — CEFAZOLIN SODIUM 1-5 GM-% IV SOLN
1.0000 g | Freq: Four times a day (QID) | INTRAVENOUS | Status: AC
  Administered 2013-09-03 – 2013-09-04 (×3): 1 g via INTRAVENOUS
  Filled 2013-09-03 (×3): qty 50

## 2013-09-03 MED ORDER — DEXAMETHASONE SODIUM PHOSPHATE 4 MG/ML IJ SOLN
INTRAMUSCULAR | Status: DC | PRN
  Administered 2013-09-03: 4 mg via INTRAVENOUS

## 2013-09-03 MED ORDER — CEFAZOLIN SODIUM-DEXTROSE 2-3 GM-% IV SOLR
2.0000 g | Freq: Once | INTRAVENOUS | Status: AC
  Administered 2013-09-03: 2 g via INTRAVENOUS

## 2013-09-03 MED ORDER — LACTATED RINGERS IV SOLN
INTRAVENOUS | Status: DC
  Administered 2013-09-03: 13:00:00 via INTRAVENOUS

## 2013-09-03 MED ORDER — METOCLOPRAMIDE HCL 5 MG/ML IJ SOLN: 10.0000 mg | Freq: Once | INTRAMUSCULAR | Status: DC | PRN

## 2013-09-03 MED ORDER — FENTANYL CITRATE 0.05 MG/ML IJ SOLN
INTRAMUSCULAR | Status: AC
  Filled 2013-09-03: qty 2

## 2013-09-03 MED ORDER — HYDROMORPHONE HCL PF 1 MG/ML IJ SOLN
INTRAMUSCULAR | Status: AC
  Administered 2013-09-03: 0.25 mg via INTRAVENOUS
  Filled 2013-09-03: qty 1

## 2013-09-03 MED ORDER — MIDAZOLAM HCL 2 MG/2ML IJ SOLN: 1.0000 mg | INTRAMUSCULAR | Status: DC | PRN

## 2013-09-03 SURGICAL SUPPLY — 66 items
BIT DRILL F/CENTRAL SCRW 3.2 (BIT) ×1
BIT DRILL F/CENTRAL SCRW 3.2MM (BIT) ×1 IMPLANT
BIT DRILL TWIST 2.7 (BIT) ×2 IMPLANT
BLADE SAW SAG 73X25 THK (BLADE) ×1
BLADE SAW SGTL 73X25 THK (BLADE) ×1 IMPLANT
CAP REVERSE SHOULDER ×2 IMPLANT
CLSR STERI-STRIP ANTIMIC 1/2X4 (GAUZE/BANDAGES/DRESSINGS) ×2 IMPLANT
COVER SURGICAL LIGHT HANDLE (MISCELLANEOUS) ×2 IMPLANT
DRAPE IMP U-DRAPE 54X76 (DRAPES) ×4 IMPLANT
DRAPE INCISE IOBAN 66X45 STRL (DRAPES) ×2 IMPLANT
DRAPE U-SHAPE 47X51 STRL (DRAPES) ×2 IMPLANT
DRILL BIT 5/64 (BIT) IMPLANT
DRILL BIT F/CENTRAL SCRW 3.2MM (BIT) ×1
DRSG ADAPTIC 3X8 NADH LF (GAUZE/BANDAGES/DRESSINGS) ×2 IMPLANT
DRSG MEPILEX BORDER 4X8 (GAUZE/BANDAGES/DRESSINGS) ×2 IMPLANT
DRSG PAD ABDOMINAL 8X10 ST (GAUZE/BANDAGES/DRESSINGS) ×4 IMPLANT
DURAPREP 26ML APPLICATOR (WOUND CARE) ×2 IMPLANT
ELECT REM PT RETURN 9FT ADLT (ELECTROSURGICAL) ×2
ELECTRODE REM PT RTRN 9FT ADLT (ELECTROSURGICAL) ×1 IMPLANT
GLOVE BIOGEL PI IND STRL 7.0 (GLOVE) ×1 IMPLANT
GLOVE BIOGEL PI IND STRL 7.5 (GLOVE) ×2 IMPLANT
GLOVE BIOGEL PI IND STRL 8 (GLOVE) ×2 IMPLANT
GLOVE BIOGEL PI INDICATOR 7.0 (GLOVE) ×1
GLOVE BIOGEL PI INDICATOR 7.5 (GLOVE) ×2
GLOVE BIOGEL PI INDICATOR 8 (GLOVE) ×2
GLOVE BIOGEL PI ORTHO PRO SZ8 (GLOVE) ×1
GLOVE ORTHO TXT STRL SZ7.5 (GLOVE) ×2 IMPLANT
GLOVE PI ORTHO PRO STRL SZ8 (GLOVE) ×1 IMPLANT
GLOVE SURG ORTHO 8.0 STRL STRW (GLOVE) ×2 IMPLANT
GLOVE SURG SS PI 6.5 STRL IVOR (GLOVE) ×2 IMPLANT
GOWN STRL REUS W/ TWL LRG LVL3 (GOWN DISPOSABLE) ×4 IMPLANT
GOWN STRL REUS W/TWL 2XL LVL3 (GOWN DISPOSABLE) ×2 IMPLANT
GOWN STRL REUS W/TWL LRG LVL3 (GOWN DISPOSABLE) ×4
KIT BASIN OR (CUSTOM PROCEDURE TRAY) ×2 IMPLANT
KIT ROOM TURNOVER OR (KITS) ×2 IMPLANT
MANIFOLD NEPTUNE II (INSTRUMENTS) ×2 IMPLANT
NDL SUT .5 MAYO 1.404X.05X (NEEDLE) ×1 IMPLANT
NEEDLE 22X1 1/2 (OR ONLY) (NEEDLE) ×2 IMPLANT
NEEDLE HYPO 25GX1X1/2 BEV (NEEDLE) IMPLANT
NEEDLE MAYO TAPER (NEEDLE) ×1
NS IRRIG 1000ML POUR BTL (IV SOLUTION) ×2 IMPLANT
PACK SHOULDER (CUSTOM PROCEDURE TRAY) ×2 IMPLANT
PAD ARMBOARD 7.5X6 YLW CONV (MISCELLANEOUS) ×4 IMPLANT
PIN THREADED REVERSE (PIN) ×2 IMPLANT
SLING ARM FOAM STRAP LRG (SOFTGOODS) ×2 IMPLANT
SLING ARM IMMOBILIZER LRG (SOFTGOODS) ×2 IMPLANT
SLING ARM IMMOBILIZER MED (SOFTGOODS) IMPLANT
SPONGE GAUZE 4X4 12PLY (GAUZE/BANDAGES/DRESSINGS) ×2 IMPLANT
SPONGE LAP 18X18 X RAY DECT (DISPOSABLE) ×2 IMPLANT
STRIP CLOSURE SKIN 1/2X4 (GAUZE/BANDAGES/DRESSINGS) ×2 IMPLANT
SUCTION FRAZIER TIP 10 FR DISP (SUCTIONS) ×2 IMPLANT
SUT FIBERWIRE #2 38 T-5 BLUE (SUTURE) ×8
SUT MAXBRAID (SUTURE) ×2 IMPLANT
SUT MNCRL AB 4-0 PS2 18 (SUTURE) ×4 IMPLANT
SUT MON AB 2-0 CT1 36 (SUTURE) ×2 IMPLANT
SUT VIC AB 0 CT1 27 (SUTURE) ×2
SUT VIC AB 0 CT1 27XBRD ANBCTR (SUTURE) ×2 IMPLANT
SUT VIC AB 2-0 CT1 27 (SUTURE) ×1
SUT VIC AB 2-0 CT1 TAPERPNT 27 (SUTURE) ×1 IMPLANT
SUTURE FIBERWR #2 38 T-5 BLUE (SUTURE) ×4 IMPLANT
SYR CONTROL 10ML LL (SYRINGE) ×2 IMPLANT
TOWEL OR 17X24 6PK STRL BLUE (TOWEL DISPOSABLE) ×2 IMPLANT
TOWEL OR 17X26 10 PK STRL BLUE (TOWEL DISPOSABLE) ×2 IMPLANT
TOWER CARTRIDGE SMART MIX (DISPOSABLE) IMPLANT
TRAY FOLEY CATH 14FR (SET/KITS/TRAYS/PACK) IMPLANT
WATER STERILE IRR 1000ML POUR (IV SOLUTION) ×2 IMPLANT

## 2013-09-03 NOTE — Progress Notes (Signed)
I again had a lengthy discussion with Kerri Hurst and her daughter. We discussed the risks and benefits of surgery for a reverse total vs head ressection vs non-op management. I was very clear of the risks with any surgery including but not limited to bleeding, infection, need for removal of hardware and anesthetic risks includinig death. The patient and daughter would like to procede with a reverse TSA.    Edmonia Lynch

## 2013-09-03 NOTE — Anesthesia Postprocedure Evaluation (Signed)
  Anesthesia Post-op Note  Patient: Kerri Hurst  Procedure(s) Performed: Procedure(s): REVERSE SHOULDER ARTHROPLASTY (Left)  Patient Location: PACU  Anesthesia Type:GA combined with regional for post-op pain  Level of Consciousness: awake, alert , oriented and patient cooperative  Airway and Oxygen Therapy: Patient Spontanous Breathing  Post-op Pain: none  Post-op Assessment: Post-op Vital signs reviewed, Patient's Cardiovascular Status Stable, Respiratory Function Stable, Patent Airway, No signs of Nausea or vomiting and Pain level controlled  Post-op Vital Signs: stable  Last Vitals:  Filed Vitals:   09/03/13 1826  BP: 170/81  Pulse:   Temp: 36.4 C  Resp:     Complications: No apparent anesthesia complications

## 2013-09-03 NOTE — Op Note (Signed)
08/30/2013 - 09/03/2013  4:30 PM  PATIENT:  Kerri Hurst    PRE-OPERATIVE DIAGNOSIS:  fracture dislocation  POST-OPERATIVE DIAGNOSIS:  Same  PROCEDURE:  REVERSE SHOULDER ARTHROPLASTY  SURGEON:  Lesha Jager, D, MD  PHYSICIAN ASSISTANT: Joya Gaskins, OPA-C, present and scrubbed throughout the case, critical for completion in a timely fashion, and for retraction, instrumentation, and closure.  ANESTHESIA:   General  PREOPERATIVE INDICATIONS:  [name] is a  80 y.o. female with a diagnosis of fracture dislocation who failed conservative measures and elected for surgical management.    The risks benefits and alternatives were discussed with the patient preoperatively including but not limited to the risks of infection, bleeding, nerve injury, cardiopulmonary complications, the need for revision surgery, dislocation, brachial plexus palsy, incomplete relief of pain, among others, and the patient was willing to proceed.  OPERATIVE IMPLANTS: Biomet size 8 mini humeral stem press-fit standard with a  44 mm reverse shoulder arthroplasty tray with a 36 liner and a 36 mm glenosphere with a mini baseplate and 4 locking screws and one central nonlocking screw.  OPERATIVE FINDINGS: Due to her chronic dislocation she had avulsed a portion of her conjoined tendon she had essentially worn through her subscapularis musculature she had fractured both tuberosities off and she had avulsed a small portion of her pack insertion.  OPERATIVE PROCEDURE: The patient was brought to the operating room and placed in the supine position. General anesthesia was administered. IV antibiotics were given. Time out was performed. The upper extremity was prepped and draped in usual sterile fashion. The patient was in a beachchair position. Deltopectoral approach was carried out. The biceps was tenodesed to the pectoralis tendon with #2 Fiberwire. The subscapularis was released off of the bone.   I then performed  circumferential releases of the humerus, and then used an osteotome to remove the head is a partially healed in a malunited position. and then reamed with the reamer to the above named size.  Deep retractors were placed, and I resected the labrum, and then placed a guidepin into the center position on the glenoid, with slight inferior inclination. I then reamed over the guidepin, and this created a small metaphyseal cancellus blush inferiorly, removing just the cartilage to the subchondral bone superiorly. The base plate was selected and impacted place, and then I secured it centrally with a nonlocking screw, and I had excellent purchase both inferiorly and superiorly. I placed a short locking screws on anterior and posterior aspects.  I then turned my attention to the glenosphere, and impacted this into place, placing slight inferior offset (set on B).   The glenoid sphere was completely seated, and had engagement of the [facility] taper. I then turned my attention back to the humerus.  I sequentially broached, and then trialed, and was found to restore soft tissue tension, and it had 2 finger tightness. Therefore the above named components were selected. The shoulder felt stable throughout functional motion.  I then impacted the real prosthesis into place, as well as the real humeral tray, and reduced the shoulder. The shoulder had excellent motion, and was stable, and I irrigated the wounds copiously.   Before I placed the real prosthesis I had also placed a total of 3 #2 FiberWire through drill holes in the humerus. I then used these to repair the subscapularis. This came down to bone.  I then irrigated the shoulder copiously once more, repaired the deltopectoral interval with Vicryl followed by subcutaneous Vicryl with Steri-Strips and sterile  gauze for the skin. The patient was awakened and returned back in stable and satisfactory condition. There no complications and She tolerated the procedure  well.

## 2013-09-03 NOTE — Anesthesia Procedure Notes (Signed)
Procedure Name: Intubation Date/Time: 09/03/2013 2:11 PM Performed by: Carola Frost Pre-anesthesia Checklist: Patient identified, Timeout performed, Emergency Drugs available, Suction available and Patient being monitored Patient Re-evaluated:Patient Re-evaluated prior to inductionOxygen Delivery Method: Circle system utilized Preoxygenation: Pre-oxygenation with 100% oxygen Intubation Type: IV induction Ventilation: Mask ventilation without difficulty Grade View: Grade I Tube type: Oral Tube size: 7.0 mm Number of attempts: 1 Airway Equipment and Method: Stylet and Video-laryngoscopy Placement Confirmation: positive ETCO2,  CO2 detector,  ETT inserted through vocal cords under direct vision and breath sounds checked- equal and bilateral Secured at: 22 cm Tube secured with: Tape Dental Injury: Teeth and Oropharynx as per pre-operative assessment  Difficulty Due To: Difficult Airway- due to reduced neck mobility and Difficulty was anticipated

## 2013-09-03 NOTE — Progress Notes (Signed)
SLP Cancellation Note  Patient Details Name: Kerri Hurst MRN: [id number] DOB: 08/01/1926   Cancelled treatment:       Reason Eval/Treat Not Completed:  (pt npo for surgical procedure today, will continue efforts)   Luanna Salk, [place] [facility] SLP [phone]

## 2013-09-03 NOTE — Transfer of Care (Signed)
Immediate Anesthesia Transfer of Care Note  Patient: Kerri Hurst  Procedure(s) Performed: Procedure(s): REVERSE SHOULDER ARTHROPLASTY (Left)  Patient Location: PACU  Anesthesia Type:General  Level of Consciousness: awake and alert   Airway & Oxygen Therapy: Patient Spontanous Breathing and Patient connected to nasal cannula oxygen  Post-op Assessment: Report given to PACU RN, Post -op Vital signs reviewed and stable and Patient moving all extremities X 4  Post vital signs: Reviewed and stable  Complications: No apparent anesthesia complications

## 2013-09-03 NOTE — Anesthesia Preprocedure Evaluation (Addendum)
Anesthesia Evaluation  Patient identified by MRN, date of birth, ID band Patient awake    Reviewed: Allergy & Precautions, H&P , NPO status , Patient's Chart, lab work & pertinent test results, reviewed documented beta blocker date and time   History of Anesthesia Complications (+) Emergence Delirium and history of anesthetic complications  Airway Mallampati: II TM Distance: >3 FB Neck ROM: full    Dental   Pulmonary neg pulmonary ROS,  breath sounds clear to auscultation        Cardiovascular negative cardio ROS  Rhythm:regular     Neuro/Psych PSYCHIATRIC DISORDERS negative neurological ROS     GI/Hepatic negative GI ROS, Neg liver ROS,   Endo/Other  negative endocrine ROS  Renal/GU Renal InsufficiencyRenal disease  negative genitourinary   Musculoskeletal   Abdominal   Peds  Hematology negative hematology ROS (+)   Anesthesia Other Findings See surgeon's H&P   Reproductive/Obstetrics negative OB ROS                           Anesthesia Physical Anesthesia Plan  ASA: III  Anesthesia Plan: General   Post-op Pain Management:    Induction: Intravenous  Airway Management Planned: Oral ETT  Additional Equipment:   Intra-op Plan:   Post-operative Plan: Extubation in OR  Informed Consent: I have reviewed the patients History and Physical, chart, labs and discussed the procedure including the risks, benefits and alternatives for the proposed anesthesia with the patient or authorized representative who has indicated his/her understanding and acceptance.   Dental Advisory Given  Plan Discussed with: CRNA and Surgeon  Anesthesia Plan Comments:         Anesthesia Quick Evaluation

## 2013-09-03 NOTE — H&P (View-Only) (Signed)
ORTHOPAEDIC CONSULTATION  REQUESTING PHYSICIAN: [name], DO  Chief Complaint: Left shoulder prox humerus fracture  HPI: Kerri Hurst is a 80 y.o. female who complains of  a fall on 08/08/2013. [place] was initially seen and [place] [facility] and then later sent to [facility]. She was evaluated there and was sent for followup. She's continued to have distal to the pain control due to her fracture dislocation of her left shoulder. As unclear to me exactly what was done as an inpatient at [facility]. Patient was then admitted overnight due to delirium secondary to narcotic knee. As well as failure to thrive.   I placed some call to have this discovered that she does have followup that will be arranged over there. Her daughter is very concerned about taking her out of the hospital again that will not go to control her pain and is very concerned about getting her to that followup desires to keep her care at [facility].  Past Medical History  Diagnosis Date  . Arthritis    Past Surgical History  Procedure Laterality Date  . Abdominal hysterectomy    . Cholecystectomy     History   Social History  . Marital Status: Widowed    Spouse Name: N/A    Number of Children: N/A  . Years of Education: N/A   Social History Main Topics  . Smoking status: Never Smoker   . Smokeless tobacco: None  . Alcohol Use: No  . Drug Use: No  . Sexual Activity: None   Other Topics Concern  . None   Social History Narrative  . None   No family history on file. Allergies  Allergen Reactions  . Aricept [Donepezil Hcl] Other (See Comments)    Night mares   . Boniva [Ibandronic Acid] Other (See Comments)    Doesn't remember   . Cymbalta [Duloxetine Hcl] Other (See Comments)    Night mares   . Levaquin [Levofloxacin]     nightmares  . Oxybutynin Chloride     Doesn't remember   . Silver Sulfadiazine     Doesn't remember   . Tegaderm Ag Mesh [Silver]   . Keflex [Cephalexin] Rash   Prior to  Admission medications   Medication Sig Start Date End Date Taking? Authorizing Provider  beta carotene w/minerals (OCUVITE) tablet Take 1 tablet by mouth 2 (two) times daily.   Yes Historical Provider, MD  HYDROcodone-acetaminophen (NORCO) 7.5-325 MG per tablet Take 1 tablet by mouth every 6 (six) hours as needed for moderate pain.   Yes Historical Provider, MD  HYDROmorphone (DILAUDID) 4 MG tablet Take 1 tablet (4 mg total) by mouth every 4 (four) hours as needed for severe pain. 08/24/13  Yes Leeanne Rio, MD  LORazepam (ATIVAN) 0.5 MG tablet Take 0.5 mg by mouth every 8 (eight) hours.   Yes Historical Provider, MD  Pediatric Multiple Vit-C-FA (MULTIVITAMIN ANIMAL SHAPES, WITH CA/FA,) WITH C & FA CHEW chewable tablet Chew 1 tablet by mouth 2 (two) times daily.   Yes Historical Provider, MD  pravastatin (PRAVACHOL) 20 MG tablet Take 20 mg by mouth every morning.   Yes Historical Provider, MD   Ct Head Wo Contrast  08/30/2013   CLINICAL DATA:  Altered mental status with weakness.  EXAM: CT HEAD WITHOUT CONTRAST  TECHNIQUE: Contiguous axial images were obtained from the base of the skull through the vertex without contrast.  COMPARISON:  08/09/2013.  FINDINGS: Generalized atrophy. Chronic microvascular ischemic change. No evidence for  acute infarction, hemorrhage, mass lesion, hydrocephalus, or extra-axial fluid. Vascular calcification. Calvarium intact. No sinus or mastoid air fluid levels. Similar appearance to priors.  IMPRESSION: No acute intracranial abnormality. Chronic changes as described, similar to priors.   Electronically Signed   By: Rolla Flatten M.D.   On: 08/30/2013 19:20   Dg Chest Portable 1 View  08/30/2013   CLINICAL DATA:  ALTERED MENTAL STATUS WEAKNESS  EXAM: PORTABLE CHEST - 1 VIEW  COMPARISON:  08/24/2013  FINDINGS: Comminuted left humerus fracture with dislocation of the glenohumeral joint.  Normal heart size.  Lungs hyper aerated and clear.  IMPRESSION: No active  cardiopulmonary disease.   Electronically Signed   By: Maryclare Bean M.D.   On: 08/30/2013 17:34   Dg Shoulder Left Port  08/30/2013   CLINICAL DATA:  Shoulder pain  EXAM: PORTABLE LEFT SHOULDER - 2+ VIEW  COMPARISON:  08/18/2013  FINDINGS: There is again noted comminuted fracture of the proximal left humerus with inferior dislocation. The overall appearance is stable from the prior exam. No new focal abnormality is seen.  IMPRESSION: No significant interval change from the previous study   Electronically Signed   By: Inez Catalina M.D.   On: 08/30/2013 22:04    Positive ROS: All other systems have been reviewed and were otherwise negative with the exception of those mentioned in the HPI and as above.  Labs cbc  Recent Labs  08/30/13 1700 08/30/13 1717  WBC 8.4  --   HGB 10.8* 11.6*  HCT 33.0* 34.0*  PLT 209  --     Labs inflam No results found for this basename: ESR, CRP,  in the last 72 hours  Labs coag No results found for this basename: INR, PT, PTT,  in the last 72 hours   Recent Labs  08/30/13 1700 08/30/13 1717  NA 138 137  K 4.5 4.3  CL 99 103  CO2 25  --   GLUCOSE 120* 117*  BUN 29* 28*  CREATININE 1.33* 1.30*  CALCIUM 9.6  --     Physical Exam: Filed Vitals:   08/30/13 2210  BP: 132/70  Pulse: 96  Temp: 97.3 F (36.3 C)  Resp: 14   General: Alert, no acute distress Cardiovascular: No pedal edema Respiratory: No cyanosis, no use of accessory musculature GI: No organomegaly, abdomen is soft and non-tender Skin: No lesions in the area of chief complaint Neurologic: Sensation intact distally Lymphatic: No axillary or cervical lymphadenopathy  MUSCULOSKELETAL:  LUE: She has significant pain with range of motion of her shoulder. She has swelling throughout the extremity. Distally she is able to wiggle her fingers she reports normal sensation to palpation there. She has good pulses radial and ulnar. Compartments are soft.  Other extremities are atraumatic  with painless ROM and NVI.  Assessment: Subacute Left shoulder fracture dislocation.   Plan: A lengthy discussion with the patient and her daughter today. I think that her shoulder does need to be reduced versus a reverse shoulder arthroplasty given the likely injury to her rotator cuff versus a resection of a rotator head. I discussed the risks and benefits of the the above procedures with the daughter I think that fixing her proximal humerus fracture would be a mistake is given her bone quality a fear that it would fall apart. The other alternatives are doing nothing versus a reverse shoulder versus humeral head resection after discussion of all these the daughter would like to go forward with a reverse shoulder as would  her the patient. Weight Bearing Status: sling  We will initiate the preop medical clearance.  Plan for Left Reverse total shoulder arthroplasty on 6/15  PT VTE px: SCD's and Chemical px per the primary team   Edmonia Lynch, D, MD Cell [phone]   08/31/2013 4:54 AM

## 2013-09-03 NOTE — Progress Notes (Signed)
PROGRESS NOTE  Kerri Hurst GHW:[id number] DOB: 02/18/1927 DOA: 08/30/2013 PCP: Houston Siren, MD Cardiologist Dr. Ricky Ala at [facility]  Summary: 80 year old woman suffered a dislocation fracture left shoulder 5/21, not reduce at that time. Outpatient followup is planned. Patient chew with Norco and oral Dilaudid and afterwards became more lethargic and less active.  Assessment/Plan: 1. Traumatic closed comminuted fracture left shoulder with dislocation. Operative intervention planned today. 2. Acute renal failure, dehydration. Resolved. Likely secondary to dehydration.  3. Subacute encephalopathy. Resolved. Secondary to narcotics related to pain management of left shoulder injury. CT head no acute abnormality. Chest x-ray no acute disease. Urinalysis unremarkable. Remains afebrile. 4. Orthostatic hypotension, appears to be a subacute issues (see neurology consultation at [facility] in care everywhere). 5. Atrial fibrillation rapid ventricular response was noted in the chart by emergency department RN, however EKG on admission and repeat EKG 6/12 showed sinus rhythm. Case was discussed with her cardiologist who reports no evidence of atrial fibrillation in studies that he is aware of. No recent echocardiogram on file but the patient has no history of cardiac disease. 6. Globus sensation. Tolerating soft diet. H/o GI issues in past, long time since dilatation. Recommend outpatient GI follow-up.   Discussed in detail with daughter as well as the patient's cardiologist by telephone. Daughter reports cardiac ultrasound was recently done, however I cannot find any records of this in care everywhere. She reports that she was told the ultrasound was normal. She declines echocardiogram here and is aware that heart function cannot be commented on without it. However the patient has no history of cardiac disease and no signs or symptoms to suggest cardiac process, therefore proceed with  surgery today. Main risk factors are advanced age and debility.  Case discussed with Dr. Percell Miller.  Code Status: full code DVT prophylaxis: Heparin Family Communication: discussed with daughter at bedside 6/15 Disposition Plan: Likely SNF  Murray Hodgkins, MD  [facility]  Pager [phone] If 7PM-7AM, please contact night-coverage at [email or web address], password [facility] 09/03/2013, 8:46 AM  LOS: 4 days   Consultants:  Orthopedics   Occupational therapy. Skilled nursing facility  Speech therapy. Regular diet, thin liquids.  Procedures:    Antibiotics:    HPI/Subjective: Feeling ok. Anxious about surgery today. No chest pain or shortness of breath.  Objective: Filed Vitals:   09/02/13 1028 09/02/13 2101 09/03/13 0550 09/03/13 0820  BP: 158/72 158/75 165/81 162/82  Pulse: 84 78 60 84  Temp: 98.1 F (36.7 C) 98.1 F (36.7 C) 97.5 F (36.4 C) 98.3 F (36.8 C)  TempSrc: Oral Oral Oral Oral  Resp: 10 16 16    Height:      Weight:      SpO2: 97% 98% 97%     Intake/Output Summary (Last 24 hours) at 09/03/13 0846 Last data filed at 09/03/13 0500  Gross per 24 hour  Intake 615.67 ml  Output      4 ml  Net 611.67 ml     Filed Weights   08/30/13 2210  Weight: 47.9 kg (105 lb 9.6 oz)    Exam:   Afebrile, vital signs stable. Hypoxia. Gen. Appears calm, comfortable. Psych. Alert, follows commands. Grossly normal medication. Cardiovascular. Regular rate and rhythm. No murmur, rub or gallop. Respiratory. Clear to auscultation bilaterally. No wheezes, rales or rhonchi. Normal respiratory effort.   Data Reviewed: I/O: Multiple voids, BM x1 Chemistry: Basic metabolic panel unremarkable.   Scheduled Meds: . beta carotene w/minerals  1 tablet Oral BID  . ibuprofen  200 mg  Oral TID  . pantoprazole  40 mg Oral Daily  . pneumococcal 23 valent vaccine  0.5 mL Intramuscular Tomorrow-1000  . simvastatin  10 mg Oral q1800   Continuous Infusions:    Principal  Problem:   Traumatic closed displaced fracture of left shoulder with anterior dislocation Active Problems:   Delirium due to general medical condition   Fracture dislocation of left shoulder joint   Orthostatic hypotension   Encephalopathy   Acute renal failure   Time spent 20 minutes

## 2013-09-03 NOTE — Interval H&P Note (Signed)
History and Physical Interval Note:  09/03/2013 8:37 AM  Kerri Hurst  has presented today for surgery, with the diagnosis of fracture dislocation  The various methods of treatment have been discussed with the patient and family. After consideration of risks, benefits and other options for treatment, the patient has consented to  Procedure(s): REVERSE SHOULDER ARTHROPLASTY (Left) as a surgical intervention .  The patient's history has been reviewed, patient examined, no change in status, stable for surgery.  I have reviewed the patient's chart and labs.  Questions were answered to the patient's satisfaction.     Buffy Ehler, D

## 2013-09-04 ENCOUNTER — Encounter (HOSPITAL_COMMUNITY): Payer: Self-pay | Admitting: Orthopedic Surgery

## 2013-09-04 LAB — CBC
HCT: 27.9 % — ABNORMAL LOW (ref 36.0–46.0)
HEMOGLOBIN: 9.4 g/dL — AB (ref 12.0–15.0)
MCH: 31.3 pg (ref 26.0–34.0)
MCHC: 33.7 g/dL (ref 30.0–36.0)
MCV: 93 fL (ref 78.0–100.0)
Platelets: 198 10*3/uL (ref 150–400)
RBC: 3 MIL/uL — ABNORMAL LOW (ref 3.87–5.11)
RDW: 13.9 % (ref 11.5–15.5)
WBC: 8.9 10*3/uL (ref 4.0–10.5)

## 2013-09-04 LAB — BASIC METABOLIC PANEL
BUN: 16 mg/dL (ref 6–23)
CALCIUM: 8.4 mg/dL (ref 8.4–10.5)
CO2: 24 mEq/L (ref 19–32)
CREATININE: 1.11 mg/dL — AB (ref 0.50–1.10)
Chloride: 106 mEq/L (ref 96–112)
GFR, EST AFRICAN AMERICAN: 51 mL/min — AB (ref >90)
GFR, EST NON AFRICAN AMERICAN: 44 mL/min — AB (ref >90)
GLUCOSE: 129 mg/dL — AB (ref 70–99)
POTASSIUM: 4 meq/L (ref 3.7–5.3)
Sodium: 143 mEq/L (ref 137–147)

## 2013-09-04 MED ORDER — ASPIRIN EC 81 MG PO TBEC
81.0000 mg | DELAYED_RELEASE_TABLET | Freq: Every day | ORAL | Status: DC
  Administered 2013-09-04 – 2013-09-06 (×3): 81 mg via ORAL
  Filled 2013-09-04 (×3): qty 1

## 2013-09-04 MED ORDER — SENNOSIDES-DOCUSATE SODIUM 8.6-50 MG PO TABS
2.0000 | ORAL_TABLET | Freq: Two times a day (BID) | ORAL | Status: DC
  Administered 2013-09-04 (×2): 2 via ORAL
  Filled 2013-09-04 (×2): qty 2

## 2013-09-04 MED ORDER — METOCLOPRAMIDE HCL 5 MG/ML IJ SOLN
10.0000 mg | Freq: Once | INTRAMUSCULAR | Status: AC
  Administered 2013-09-04: 10 mg via INTRAVENOUS
  Filled 2013-09-04: qty 2

## 2013-09-04 MED ORDER — POLYETHYLENE GLYCOL 3350 17 G PO PACK
17.0000 g | PACK | Freq: Every day | ORAL | Status: DC
Start: 2013-09-04 — End: 2013-09-06
  Administered 2013-09-04 – 2013-09-05 (×2): 17 g via ORAL
  Filled 2013-09-04 (×4): qty 1

## 2013-09-04 MED ORDER — IBUPROFEN 400 MG PO TABS
400.0000 mg | ORAL_TABLET | Freq: Four times a day (QID) | ORAL | Status: DC | PRN
  Filled 2013-09-04: qty 1

## 2013-09-04 MED ORDER — DEXTROSE-NACL 5-0.45 % IV SOLN: INTRAVENOUS | Status: AC

## 2013-09-04 MED ORDER — HYDROCODONE-ACETAMINOPHEN 5-325 MG PO TABS
1.0000 | ORAL_TABLET | Freq: Four times a day (QID) | ORAL | Status: DC | PRN
  Administered 2013-09-04 (×2): 1 via ORAL
  Administered 2013-09-05 (×2): 2 via ORAL
  Filled 2013-09-04 (×3): qty 2
  Filled 2013-09-04: qty 1

## 2013-09-04 NOTE — Progress Notes (Signed)
Physical Therapy Treatment Patient Details Name: Kerri Hurst MRN: [id number] DOB: 11-16-1926 Today's Date: 09/04/2013    History of Present Illness Kerri Hurst is an 80 y.o. Female s/p reverse shoulder arthroplasty. She was admitted 08/30/13 with decreased activity, PO intake, and inability to get out of bed. Symptoms were progressively worse since fall on 08/09/13, during which she sustained a Lt shoulder fx that was not diagnosed at initial ED visit    PT Comments    Pt mobility extremely limited by L shoulder pain. MD and RN aware. Pt did tolerate OOB to chair well despite pain. Con't to recommend SNF upon d/c for maximal functional recovery.  Follow Up Recommendations  SNF;Supervision/Assistance - 24 hour     Equipment Recommendations       Recommendations for Other Services       Precautions / Restrictions Precautions Precautions: Fall;Shoulder Type of Shoulder Precautions: NWB, wrist/elbow ROM only Shoulder Interventions: Shoulder sling/immobilizer;Off for dressing/bathing/exercises Precaution Booklet Issued: Yes (comment) Precaution Comments: reviewed shoulder rehab protocol with daughter with pt present. Pt moaning, nauseous, and in pain. Required Braces or Orthoses: Sling Restrictions Weight Bearing Restrictions: Yes LUE Weight Bearing: Non weight bearing Other Position/Activity Restrictions: no order for elbow or shoulder ROM    Mobility  Bed Mobility Overal bed mobility: Needs Assistance Bed Mobility: Supine to Sit     Supine to sit: Max assist;+2 for physical assistance     General bed mobility comments: used bed pad behind trunk to assist with elevation due to extremely painful L shoulder, pt initiated LE movement to EOB but required assist to bring off EOB  Transfers Overall transfer level: Needs assistance Equipment used:  (2 person lift with gait belt) Transfers: Sit to/from Stand Sit to Stand: Max assist;+2 physical assistance Stand pivot  transfers: Max assist;+2 physical assistance       General transfer comment: transfered pt to bsc then to chair. pt with retropulsion however responded well to v/c's to lean forward. pt did take steps during both transfers  Ambulation/Gait Ambulation/Gait assistance:  (unable to at this time)               Financial trader Rankin (Stroke Patients Only)       Balance Overall balance assessment: Needs assistance Sitting-balance support: Feet supported;No upper extremity supported Sitting balance-Leahy Scale: Fair Sitting balance - Comments: pt with slight posterior lean but able to maintain balance   Standing balance support: Single extremity supported Standing balance-Leahy Scale: Zero Standing balance comment: physical assist required                    Cognition Arousal/Alertness: Lethargic Behavior During Therapy: Flat affect Overall Cognitive Status: Within Functional Limits for tasks assessed Area of Impairment: Orientation;Memory Orientation Level: Disoriented to;Place;Time;Situation   Memory: Decreased short-term memory         General Comments: pt limited by pain    Exercises      General Comments General comments (skin integrity, edema, etc.): pt voided on BSC, pt able to wash face but dependent for peri-hygiene      Pertinent Vitals/Pain 10/10 L shoulder pain    Home Living Family/patient expects to be discharged to:: Skilled nursing facility Living Arrangements: Children Available Help at Discharge: Family Type of Home: Apartment Home Access: Stairs to enter Entrance Stairs-Rails: None Home Layout: One level Home Equipment: Environmental consultant - 4 wheels;Shower seat  Prior Function Level of Independence: Needs assistance  Gait / Transfers Assistance Needed: Intermittent use of rollator ADL's / Homemaking Assistance Needed: Needs assistance with bath/dress     PT Goals (current goals can now be  found in the care plan section) Acute Rehab PT Goals Patient Stated Goal: stop hurting Progress towards PT goals: Progressing toward goals    Frequency  Min 3X/week    PT Plan Current plan remains appropriate    Co-evaluation             End of Session Equipment Utilized During Treatment: Gait belt Activity Tolerance: Patient limited by pain Patient left: in chair;with call bell/phone within reach;with family/visitor present     Time: 4580-9983 PT Time Calculation (min): 27 min  Charges:  $Therapeutic Activity: 23-37 mins                    G Codes:      Kingsley Callander 09/04/2013, 1:47 PM   Kittie Plater, PT, DPT Pager #: [phone] Office #: [phone]

## 2013-09-04 NOTE — Progress Notes (Signed)
[facility] PROGRESS NOTE  Kerri Hurst GEX:[id number] DOB: [birth year] DOA: 08/30/2013 PCP: Houston Siren, MD  Brief summary  80 year old woman suffered a dislocation fracture left shoulder 5/21, not reduce at that time. Outpatient followup is planned. Patient chew with Norco and oral Dilaudid and afterwards became more lethargic and less active.   Assessment/Plan:  1. Traumatic closed comminuted fracture left shoulder with dislocation.  -s/p L shoulder arthoplasty on 6/15, Pt is still reports pain, increase pain control/monitor for side effects; add bowel regimen; PT/OT pend ? SNF  2. Acute renal failure, dehydration. Resolved. Likely secondary to dehydration.  3. Subacute encephalopathy. Resolved. Secondary to narcotics related to pain management of left shoulder injury. CT head no acute abnormality. Chest x-ray no acute disease. Urinalysis unremarkable. Remains afebrile. 4. Orthostatic hypotension, appears to be a subacute issues (see neurology consultation at [facility] in care everywhere). -if persistent HTN, amy consider starting amlodipine  5. Atrial fibrillation rapid ventricular response was noted in the chart by emergency department RN, however EKG on admission and repeat EKG 6/12 showed sinus rhythm. Dr. Sarajane Jews discussed with her cardiologist who reports no evidence of atrial fibrillation in studies that he is aware of. No recent echocardiogram on file but the patient has no history of cardiac disease. Added ASA 6. Dysphasia, Pt tolerating soft diet. H/o GI issues in past, h/o dilatation.  -obtain SLP eval; likely needs GI follow-up ? Outpatient; cont PPI  7. Constipation, likely related to opioids; abd exam benign;  Added bowel regimen   Discussed in detail with daughter and Dr. Sarajane Jews d/w patient's cardiologist by telephone. Daughter reports cardiac ultrasound was recently done, however I cannot find any records of this in care everywhere. She reports that she was told the  ultrasound was normal. She declines echocardiogram here and is aware that heart function cannot be commented on without it. However the patient has no history of cardiac disease and no signs or symptoms to suggest cardiac process, therefore proceed with surgery today. Main risk factors are advanced age and debility.    Code Status: full Family Communication:  D/w patient, her daughter  (indicate person spoken with, relationship, and if by phone, the number) Disposition Plan: ? SNF   Consultants:  ortho  Procedures:  S/p surgery 6/15  Antibiotics:  none (indicate start date, and stop date if known)  HPI/Subjective: alert  Objective: Filed Vitals:   09/04/13 0529  BP: 144/67  Pulse: 62  Temp: 97.9 F (36.6 C)  Resp: 16    Intake/Output Summary (Last 24 hours) at 09/04/13 1017 Last data filed at 09/04/13 0900  Gross per 24 hour  Intake   1510 ml  Output    750 ml  Net    760 ml   Filed Weights   08/30/13 2210  Weight: 47.9 kg (105 lb 9.6 oz)    Exam:   General:  alert  Cardiovascular: s1,s2 rrr  Respiratory: few crackles LL  Abdomen: soft, nt,nd   Musculoskeletal: no LE edema   Data Reviewed: Basic Metabolic Panel:  Recent Labs Lab 08/30/13 1700 08/30/13 1717 09/01/13 1120 09/02/13 0530 09/03/13 0650 09/04/13 0713  NA 138 137 140 141 142 143  K 4.5 4.3 4.2 3.9 3.7 4.0  CL 99 103 104 103 107 106  CO2 25  --  23 23 22 24   GLUCOSE 120* 117* 109* 97 97 129*  BUN 29* 28* 17 15 14 16   CREATININE 1.33* 1.30* 0.96 1.09 1.16* 1.11*  CALCIUM 9.6  --  8.5 9.1 9.0 8.4   Liver Function Tests:  Recent Labs Lab 08/30/13 1700  AST 21  ALT 13  ALKPHOS 114  BILITOT 0.5  PROT 6.8  ALBUMIN 3.2*   No results found for this basename: LIPASE, AMYLASE,  in the last 168 hours No results found for this basename: AMMONIA,  in the last 168 hours CBC:  Recent Labs Lab 08/30/13 1700 08/30/13 1717 09/04/13 0713  WBC 8.4  --  8.9  NEUTROABS 6.3  --    --   HGB 10.8* 11.6* 9.4*  HCT 33.0* 34.0* 27.9*  MCV 94.3  --  93.0  PLT 209  --  198   Cardiac Enzymes: No results found for this basename: CKTOTAL, CKMB, CKMBINDEX, TROPONINI,  in the last 168 hours BNP (last 3 results) No results found for this basename: PROBNP,  in the last 8760 hours CBG:  Recent Labs Lab 08/30/13 1710  GLUCAP 111*    Recent Results (from the past 240 hour(s))  MRSA PCR SCREENING     Status: None   Collection Time    09/03/13  6:28 AM      Result Value Ref Range Status   MRSA by PCR NEGATIVE  NEGATIVE Final   Comment:            The GeneXpert MRSA Assay (FDA     approved for NASAL specimens     only), is one component of a     comprehensive MRSA colonization     surveillance program. It is not     intended to diagnose MRSA     infection nor to guide or     monitor treatment for     MRSA infections.     Studies: Dg Shoulder Left Port  09/03/2013   CLINICAL DATA:  Shoulder arthroplasty.  EXAM: PORTABLE LEFT SHOULDER - 2+ VIEW  COMPARISON:  08/31/2013.  FINDINGS: There is a new reverse shoulder or hemiarthroplasty on the left demonstrated on this single frontal view. Displacement of the greater tuberosity is present, measuring about 16 mm. Postsurgical changes are present in soft tissues.  IMPRESSION: New reverse left shoulder hemiarthroplasty.   Electronically Signed   By: Dereck Ligas M.D.   On: 09/03/2013 18:36    Scheduled Meds: . beta carotene w/minerals  1 tablet Oral BID  .  ceFAZolin (ANCEF) IV  1 g Intravenous Q6H  . enoxaparin (LOVENOX) injection  30 mg Subcutaneous Daily  . ibuprofen  200 mg Oral TID  . pantoprazole  40 mg Oral Daily  . pneumococcal 23 valent vaccine  0.5 mL Intramuscular Tomorrow-1000  . simvastatin  10 mg Oral q1800   Continuous Infusions:   Principal Problem:   Traumatic closed displaced fracture of left shoulder with anterior dislocation Active Problems:   Delirium due to general medical condition    Fracture dislocation of left shoulder joint   Orthostatic hypotension   Encephalopathy   Acute renal failure    Time spent: >35 minutes     [name]  [facility] Pager [phone]. If 7PM-7AM, please contact night-coverage at [email or web address], password [facility] 09/04/2013, 10:17 AM  LOS: 5 days

## 2013-09-04 NOTE — Clinical Social Work Psychosocial (Signed)
Clinical Social Work Department BRIEF PSYCHOSOCIAL ASSESSMENT 09/04/2013  Patient:  Kerri Hurst, Kerri Hurst     Account Number:  [id number]     Admit date:  08/30/2013  Clinical Social Worker:  Delrae Sawyers  Date/Time:  09/04/2013 04:18 PM  Referred by:  Physician  Date Referred:  09/04/2013 Referred for  SNF Placement   Other Referral:   none.   Interview type:  Family Other interview type:   CSW spoke with pt's daughter, Lockie Pares [[phone]]    PSYCHOSOCIAL DATA Living Status:  FAMILY Admitted from facility:   Level of care:   Primary support name:  Lockie Pares Primary support relationship to patient:  CHILD, ADULT Degree of support available:   Strong support system.    CURRENT CONCERNS Current Concerns  Post-Acute Placement   Other Concerns:   none.    SOCIAL WORK ASSESSMENT / PLAN CSW consulted for possible SNF placement. CSW spoke with pt's daughter regarding discharge disposition. Per pt's daughter, pt originally from home with pt's son but moved in with pt's daughter after pt's fall [08/09/2013]. [name] states pt's daughter unable to care for pt in pt's current state. CSW discussed PT recommendation for SNF placement. [name] stated she was agreeable to SNF placement for pt. CSW to continue to follow and assist with discharge planning needs.   Assessment/plan status:  Psychosocial Support/Ongoing Assessment of Needs Other assessment/ plan:   none.   Information/referral to community resources:   [facility] bed offers.    PATIENT'S/FAMILY'S RESPONSE TO PLAN OF CARE: [name] understanding and agreeable to CSW plan of care. [name] currently has no further questions or concerns.       Lubertha Sayres, MSW, [facility] Licensed Clinical Social Worker [phone] and [phone]

## 2013-09-04 NOTE — Progress Notes (Signed)
UR complete.  Courtney Robarge RN, MSN 

## 2013-09-04 NOTE — Clinical Social Work Note (Signed)
CSW attempted to speak with pt's daughter regarding discharge disposition as pt is documented as being disoriented. CSW has left message with [facility] [[phone]]. CSW to continue to follow and assist with discharge planning needs.  Lubertha Sayres, MSW, [facility] Licensed Clinical Social Worker [phone] and [phone]

## 2013-09-04 NOTE — Progress Notes (Signed)
Speech Language Pathology Treatment: Dysphagia  Patient Details Name: Kerri Hurst MRN: [id number] DOB: 1927-02-26 Today's Date: 09/04/2013 Time: 3428-7681 SLP Time Calculation (min): 17 min  Assessment / Plan / Recommendation Clinical Impression  Pt was seen for f/u for swallowing treatment. Per RN, patient's diet order has been downgraded from Dys 3/thins to a clear liquid diet due to nausea and vomiting. Pt was administered straw sips of thin liquids with no overt s/s of aspiration, although pt continues to report a globus sensation with any PO intake. Given observations and subjective reports, continue to suspect a primary esophageal component. SLP provided thorough education regarding esophageal and aspiration precautions with both the patient and her daughter. Pt appears to be tolerating PO from an oropharyngeal standpoint at this time, and further SLP f/u is not indicated.  **Given that daughter reports significantly reduced intake secondary to what appear to be more GI-based complaints (nausea/vomiting, regurgitation of food, globus sensation), recommend possible consideration of nutrition and GI involvement while in house.    HPI HPI: Pt is an 80 yo female admitted with decreased activity and decreased po intake; initial difficulty started after a fall she sustained on 08/09/13 fracturing her left shoulder and nose with multiple facial bruises; daughter and pt state "feeling of food sticking in chest." and nausea with decreased po intake   Pertinent Vitals N/A  SLP Plan  Consult other service (comment);All goals met    Recommendations Diet recommendations: Thin liquid;Dysphagia 3 (mechanical soft) (advance to Dys 3 as tolerated due to N/V) Liquids provided via: Cup;Straw Medication Administration: Whole meds with puree Supervision: Patient able to self feed Compensations: Slow rate;Small sips/bites;Follow solids with liquid Postural Changes and/or Swallow Maneuvers: Seated upright  90 degrees;Upright 30-60 min after meal              General recommendations:  (GI consult and/or esophageal assessment) Oral Care Recommendations: Oral care BID Follow up Recommendations: None Plan: Consult other service (comment);All goals met    GO      Germain Osgood, M.A. CCC-SLP [phone]  Germain Osgood 09/04/2013, 4:44 PM

## 2013-09-04 NOTE — Care Management Note (Signed)
CARE MANAGEMENT NOTE 09/04/2013  Patient:  Kerri Hurst,Kerri Hurst   Account Number:  [id number]  Date Initiated:  09/04/2013  Documentation initiated by:  Ricki Miller  Subjective/Objective Assessment:   80 yr old female s/p left reverse shoulder arthroplasty.     Action/Plan:   Patient will need shortterm rehab at [facility]. Social worker aware.   Anticipated DC Date:  09/05/2013   Anticipated DC Plan:  SKILLED NURSING FACILITY  In-house referral  Clinical Social Worker      DC Planning Services  CM consult      Choice offered to / List presented to:             Status of service:  Completed, signed off Medicare Important Message given?  YES (If response is "NO", the following Medicare IM given date fields will be blank) Date Medicare IM given:  08/30/2013 Date Additional Medicare IM given:  09/04/2013  Discharge Disposition:  [place]  Per UR Regulation:  Reviewed for med. necessity/level of care/duration of stay

## 2013-09-04 NOTE — Progress Notes (Signed)
[facility] PROGRESS NOTE  Kerri Hurst YQI:[id number] DOB: 24-Feb-1927 DOA: 08/30/2013 PCP: Houston Siren, MD  SLP: Dys 3 - Obtain esophagogram for dysphasia; nutrition consults   [name]  [facility] Pager [phone]. If 7PM-7AM, please contact night-coverage at [email or web address], password [facility] 09/04/2013, 4:53 PM  LOS: 5 days

## 2013-09-04 NOTE — Progress Notes (Signed)
    Subjective:  Patient reports pain as moderate  Objective:   VITALS:   Filed Vitals:   09/03/13 1850 09/03/13 2054 09/04/13 0125 09/04/13 0529  BP: 171/84 163/87 147/71 144/67  Pulse: 74 82 87 62  Temp: 97.4 F (36.3 C) 98.1 F (36.7 C) 98 F (36.7 C) 97.9 F (36.6 C)  TempSrc: Oral Axillary    Resp: 16 16 16 16   Height:      Weight:      SpO2: 98% 99% 97% 98%    Physical Exam LUE:  Dressing: C/D/I  Compartments soft  Some decrease in sensation over dorsum of hand but grossly intact, 2+rad puls, flicker of EPL noted, +AIN, no WE, able to spread but weak   LABS  Results for orders placed during the hospital encounter of 08/30/13 (from the past 24 hour(s))  BASIC METABOLIC PANEL     Status: Abnormal   Collection Time    09/04/13  7:13 AM      Result Value Ref Range   Sodium 143  137 - 147 mEq/L   Potassium 4.0  3.7 - 5.3 mEq/L   Chloride 106  96 - 112 mEq/L   CO2 24  19 - 32 mEq/L   Glucose, Bld 129 (*) 70 - 99 mg/dL   BUN 16  6 - 23 mg/dL   Creatinine, Ser 1.11 (*) 0.50 - 1.10 mg/dL   Calcium 8.4  8.4 - 10.5 mg/dL   GFR calc non Af Amer 44 (*) >90 mL/min   GFR calc Af Amer 51 (*) >90 mL/min  CBC     Status: Abnormal   Collection Time    09/04/13  7:13 AM      Result Value Ref Range   WBC 8.9  4.0 - 10.5 K/uL   RBC 3.00 (*) 3.87 - 5.11 MIL/uL   Hemoglobin 9.4 (*) 12.0 - 15.0 g/dL   HCT 27.9 (*) 36.0 - 46.0 %   MCV 93.0  78.0 - 100.0 fL   MCH 31.3  26.0 - 34.0 pg   MCHC 33.7  30.0 - 36.0 g/dL   RDW 13.9  11.5 - 15.5 %   Platelets 198  150 - 400 K/uL     Assessment/Plan: 1 Day Post-Op   Principal Problem:   Traumatic closed displaced fracture of left shoulder with anterior dislocation Active Problems:   Delirium due to general medical condition   Fracture dislocation of left shoulder joint   Orthostatic hypotension   Encephalopathy   Acute renal failure   PLAN: Weight Bearing: NWB LUE Minimal ROM at shoulder for pain control, OK to do ROM  at fingers/wrist/elbow Dressings: keep covered and change at first post op visit VTE prophylaxis: chemical per the primary team Dispo: ok for dispo from ortho standpoint   MURPHY, TIMOTHY, D 09/04/2013, 4:19 PM   Edmonia Lynch, MD Cell [phone]

## 2013-09-04 NOTE — Clinical Social Work Placement (Addendum)
Clinical Social Work Department CLINICAL SOCIAL WORK PLACEMENT NOTE 09/04/2013  Patient:  Kerri Hurst, Kerri Hurst  Account Number:  [id number] Admit date:  08/30/2013  Clinical Social Worker:  Delrae Sawyers  Date/time:  09/04/2013 04:22 PM  Clinical Social Work is seeking post-discharge placement for this patient at the following level of care:   SKILLED NURSING   (*CSW will update this form in Epic as items are completed)   09/04/2013  Patient/family provided with [place] Department of Clinical Social Work's list of facilities offering this level of care within the geographic area requested by the patient (or if unable, by the patient's family).  09/04/2013  Patient/family informed of their freedom to choose among providers that offer the needed level of care, that participate in Medicare, Medicaid or managed care program needed by the patient, have an available bed and are willing to accept the patient.  09/04/2013  Patient/family informed of [facility]' ownership interest in [facility], as well as of the fact that they are under no obligation to receive care at this facility.  PASARR submitted to EDS on 09/04/2013 PASARR number received on 09/04/2013  FL2 transmitted to all facilities in geographic area requested by pt/family on  09/04/2013 FL2 transmitted to all facilities within larger geographic area on 09/04/2013  Patient informed that his/her managed care company has contracts with or will negotiate with  certain facilities, including the following:     Patient/family informed of bed offers received: 09/05/13  Patient chooses bed at [place] [facility] and rehab Physician recommends and patient chooses bed at    Patient to be transferred to [facility] on 09/06/13  Patient to be transferred to facility by ambulance Patient and family notified of transfer on 09/06/13 Name of family member notified: Daughter, Lockie Pares - [phone]).  Shelle Iron,  LCSW)  The following physician request were entered in Epic:  Additional Comments:    Lubertha Sayres, MSW, [facility] Licensed Clinical Social Worker [phone] and [phone]

## 2013-09-04 NOTE — Evaluation (Signed)
Occupational Therapy Evaluation Patient Details Name: Kerri Hurst MRN: [id number] DOB: January 30, 1927 Today's Date: 09/04/2013    History of Present Illness Kerri Hurst is an 80 y.o. Female s/p reverse shoulder arthroplasty. She was admitted 08/30/13 with decreased activity, PO intake, and inability to get out of bed. Symptoms were progressively worse since fall on 08/09/13, during which she sustained a Lt shoulder fx that was not diagnosed at initial ED visit.    Clinical Impression   Pt admitted with the above diagnoses and presents with below problem list. Pt will benefit from continued acute OT to address the below listed deficits and maximize independence with basic ADLs prior to d/c to next venue. Prior to fall pt needed min A for bathing and dressing. Pt is currently total A for ADLs. OT recommending SNF placement at d/c and discussed this recommendation with daughter with pt present. Pt limited this session by pain, lethargy, and nausea. OT to continue to follow and treat as indicated.      Follow Up Recommendations  SNF    Equipment Recommendations  Other (comment) (defer to next venue)    Recommendations for Other Services Speech consult     Precautions / Restrictions Precautions Precautions: Fall;Shoulder Type of Shoulder Precautions: NWB, no ROM order Shoulder Interventions: Shoulder sling/immobilizer;Off for dressing/bathing/exercises Precaution Booklet Issued: Yes (comment) Precaution Comments: reviewed shoulder rehab protocol with daughter with pt present. Pt moaning, nauseous, and in pain. Required Braces or Orthoses: Sling Restrictions Weight Bearing Restrictions: Yes LUE Weight Bearing: Non weight bearing Other Position/Activity Restrictions: no order for elbow or shoulder ROM      Mobility Bed Mobility               General bed mobility comments: not assessed - pt in recliner at start of session  Transfers                 General  transfer comment: transfers not assessed this session. Pt moaning in pain with eyes closed, nausea, limited ability to converse due to pain/lethargy.     Balance                                            ADL Overall ADL's : Needs assistance/impaired Eating/Feeding: Minimal assistance;Sitting Eating/Feeding Details (indicate cue type and reason): assistance to pick up and hold cup due to weakness in RUE likely due to pain and nausea. SLP referreral for aspiration concerns, pt on a limited diet Grooming: Moderate assistance;Sitting Grooming Details (indicate cue type and reason): generalized weakness Upper Body Bathing: Total assistance;Sitting   Lower Body Bathing: Total assistance;Sit to/from stand   Upper Body Dressing : Sitting;Total assistance   Lower Body Dressing: Total assistance;Sit to/from stand   Toilet Transfer: Maximal assistance;Stand-pivot;BSC   Toileting- Clothing Manipulation and Hygiene: Total assistance;Sitting/lateral lean;Sit to/from stand   Tub/ Shower Transfer: Stand-pivot;Maximal assistance   Functional mobility during ADLs: +2 for safety/equipment General ADL Comments: Pt total A for ADLs due to generalized weakness, pain, nausea, and decreased use of LUE     Vision                     Perception     Praxis      Pertinent Vitals/Pain Unable to state pain number. Moaning and appeared to be 9-10/10 pain in LUE. Nursing aware.     Hand Dominance Right  Extremity/Trunk Assessment Upper Extremity Assessment Upper Extremity Assessment: LUE deficits/detail LUE Deficits / Details: s/p reverse shoulder arthroplasty as a result of a fall on 08/09/13 resulting in  a traumatic closed displaced fracture of left shoulder with anterior dislocation LUE: Unable to fully assess due to immobilization   Lower Extremity Assessment Lower Extremity Assessment: Defer to PT evaluation       Communication Communication Communication: No  difficulties   Cognition Arousal/Alertness: Lethargic Behavior During Therapy: Flat affect;Anxious (limited ability to converse due to high level of pain) Overall Cognitive Status: Impaired/Different from baseline Area of Impairment: Orientation;Memory Orientation Level: Disoriented to;Place;Time;Situation   Memory: Decreased short-term memory         General Comments: Difficulty assessing cognition due to pain/nausea   General Comments       Exercises       Shoulder Instructions      Home Living Family/patient expects to be discharged to:: Skilled nursing facility Living Arrangements: Children Available Help at Discharge: Family Type of Home: Apartment Home Access: Stairs to enter [employer] of Steps: 2 Entrance Stairs-Rails: None Home Layout: One level               Home Equipment: Walker - 4 wheels;Shower seat          Prior Functioning/Environment Level of Independence: Needs assistance  Gait / Transfers Assistance Needed: Intermittent use of rollator ADL's / Homemaking Assistance Needed: Needs assistance with bath/dress        OT Diagnosis: Generalized weakness;Acute pain   OT Problem List: Decreased strength;Decreased range of motion;Decreased activity tolerance;Impaired balance (sitting and/or standing);Pain;Decreased cognition;Impaired UE functional use;Decreased knowledge of use of DME or AE   OT Treatment/Interventions: Self-care/ADL training;Therapeutic exercise;DME and/or AE instruction;Therapeutic activities;Balance training;Patient/family education    OT Goals(Current goals can be found in the care plan section) Acute Rehab OT Goals Patient Stated Goal: not stated OT Goal Formulation: With patient/family Time For Goal Achievement: 09/14/13 Potential to Achieve Goals: Good ADL Goals Pt Will Perform Grooming: with min guard assist;sitting Pt Will Perform Upper Body Bathing: with min assist;sitting Pt Will Perform Upper Body  Dressing: with min assist;sitting Pt Will Transfer to Toilet: with min assist;stand pivot transfer;bedside commode Pt Will Perform Tub/Shower Transfer: with min assist;Squat pivot transfer;3 in 1  OT Frequency: Min 3X/week   Barriers to D/C: Decreased caregiver support  daughter reports uncontrolled pain       Co-evaluation              End of Session Nurse Communication: Other (comment) (daugther alerted nursing about pain/nausea)  Activity Tolerance: Patient limited by fatigue;Patient limited by lethargy;Patient limited by pain Patient left: in chair;with call bell/phone within reach;with family/visitor present   Time: 6599-3570 OT Time Calculation (min): 19 min Charges:  OT General Charges $OT Visit: 1 Procedure OT Evaluation $Initial OT Evaluation Tier I: 1 Procedure OT Treatments $Self Care/Home Management : 8-22 mins G-Codes:    Kerri Hurst 05-Sep-2013, 10:47 AM

## 2013-09-04 NOTE — Progress Notes (Signed)
Requested med. For "bad pain" but decided to take 1 Norco tab. for medium pain when med. brought.

## 2013-09-05 ENCOUNTER — Inpatient Hospital Stay (HOSPITAL_COMMUNITY): Payer: Medicare Other

## 2013-09-05 LAB — BASIC METABOLIC PANEL
BUN: 12 mg/dL (ref 6–23)
CHLORIDE: 107 meq/L (ref 96–112)
CO2: 22 mEq/L (ref 19–32)
Calcium: 8.3 mg/dL — ABNORMAL LOW (ref 8.4–10.5)
Creatinine, Ser: 0.93 mg/dL (ref 0.50–1.10)
GFR calc Af Amer: 64 mL/min — ABNORMAL LOW (ref >90)
GFR, EST NON AFRICAN AMERICAN: 55 mL/min — AB (ref >90)
GLUCOSE: 132 mg/dL — AB (ref 70–99)
POTASSIUM: 3.5 meq/L — AB (ref 3.7–5.3)
SODIUM: 141 meq/L (ref 137–147)

## 2013-09-05 MED ORDER — DOCUSATE SODIUM 100 MG PO CAPS
200.0000 mg | ORAL_CAPSULE | Freq: Two times a day (BID) | ORAL | Status: DC
  Administered 2013-09-05 – 2013-09-06 (×3): 200 mg via ORAL
  Filled 2013-09-05 (×4): qty 2

## 2013-09-05 MED ORDER — METOCLOPRAMIDE HCL 5 MG/ML IJ SOLN
5.0000 mg | Freq: Three times a day (TID) | INTRAMUSCULAR | Status: DC
  Administered 2013-09-05: 5 mg via INTRAVENOUS
  Filled 2013-09-05 (×3): qty 1
  Filled 2013-09-05: qty 2

## 2013-09-05 MED ORDER — SODIUM CHLORIDE 0.9 % IV SOLN
INTRAVENOUS | Status: AC
  Administered 2013-09-05: 08:00:00 via INTRAVENOUS

## 2013-09-05 MED ORDER — ENSURE COMPLETE PO LIQD
237.0000 mL | Freq: Two times a day (BID) | ORAL | Status: DC
  Administered 2013-09-06: 237 mL via ORAL

## 2013-09-05 MED ORDER — BISACODYL 10 MG RE SUPP
10.0000 mg | Freq: Every day | RECTAL | Status: DC
  Administered 2013-09-05: 10 mg via RECTAL
  Filled 2013-09-05: qty 1

## 2013-09-05 NOTE — Clinical Social Work Note (Signed)
CSW attempted to provide bed offers to pt's daughter at bedside. CSW left message with pt's daughter, Enid Derry, regarding pt's bed offers being placed in pt's room [and location in room]. CSW also informed pt's RN regarding information above.  Lubertha Sayres, MSW, [facility] Licensed Clinical Social Worker [phone] and [phone]

## 2013-09-05 NOTE — Progress Notes (Signed)
Patient Demographics  Kerri Hurst, is a 80 y.o. female, DOB - 07/18/1926, ZCH:[id number]  Admit date - 08/30/2013   Admitting Physician [name], DO  Outpatient Primary MD for the patient is [facility], [place]  LOS - 6   Chief Complaint  Patient presents with  . Altered Mental Status  . Weakness           Subjective:   Kerri Hurst today has, No headache, No chest pain, No abdominal pain - No Nausea, No new weakness tingling or numbness, No Cough - SOB.     Assessment & Plan    1. Traumatic closed comminuted fracture left shoulder with dislocation. s/p L shoulder arthoplasty on 6/15, improved pain, SNF once stable.     2. Acute renal failure, dehydration. Resolved with IV fluids. Likely secondary to dehydration.       3. Subacute metabolic encephalopathy.  Secondary to narcotics related to pain management of left shoulder injury. CT head no acute abnormality. Chest x-ray no acute disease. Urinalysis unremarkable. Remains afebrile. This problem is resolved and she is now close to baseline.     4. Orthostatic hypotension, appears to be a subacute issues (see neurology consultation at [facility] in care everywhere). - Blood pressure is slightly on the higher side we'll add low-dose beta blocker and monitor.      5. Atrial fibrillation rapid ventricular response was noted in the chart by emergency department RN, however EKG on admission and repeat EKG 6/12 showed sinus rhythm. Dr. Sarajane Jews discussed with her cardiologist who reports no evidence of atrial fibrillation in studies that he is aware of. No recent echocardiogram on file but the patient has no history of cardiac disease. Added ASA, and now low-dose beta blocker.     6. Dysphagia. Has history of esophageal  stricture in the past, currently not eating well due to nausea and problems with swallowing on dysphagia 3 diet and speech following, she is going for barium swallow, if evidence of esophageal stricture GI will be consulted, PPI to be continued.           7. Constipation, likely related to opioids; abd exam benign, KUB on 09/05/2013 benign, increased bowel regimen.        Code Status: Full  Family Communication: Daughter bedside 09/05/2013  Disposition Plan: To be decided   Procedures CT head, left shoulder arthroplasty by Dr. Edmonia Lynch on 09/03/2013, barium swallow 09/05/2013 results pending   Consults  Ortho   Medications  Scheduled Meds: . aspirin EC  81 mg Oral Daily  . beta carotene w/minerals  1 tablet Oral BID  . bisacodyl  10 mg Rectal Daily  . docusate sodium  200 mg Oral BID  . enoxaparin (LOVENOX) injection  30 mg Subcutaneous Daily  . pantoprazole  40 mg Oral Daily  . pneumococcal 23 valent vaccine  0.5 mL Intramuscular Tomorrow-1000  . polyethylene glycol  17 g Oral Daily  . simvastatin  10 mg Oral q1800   Continuous Infusions: . sodium chloride    . dextrose 5 % and 0.45% NaCl 100 mL/hr (09/04/13 1730)   PRN Meds:.HYDROcodone-acetaminophen, ibuprofen, LORazepam, ondansetron (ZOFRAN) IV, traMADol  DVT Prophylaxis  Lovenox   Lab Results  Component Value Date  PLT 198 09/04/2013    Antibiotics   Anti-infectives   Start     Dose/Rate Route Frequency Ordered Stop   09/03/13 2000  ceFAZolin (ANCEF) IVPB 1 g/50 mL premix     1 g 100 mL/hr over 30 Minutes Intravenous Every 6 hours 09/03/13 1822 09/04/13 1037   09/03/13 1445  ceFAZolin (ANCEF) IVPB 2 g/50 mL premix     2 g 100 mL/hr over 30 Minutes Intravenous  Once 09/03/13 1442 09/03/13 1416          Objective:   Filed Vitals:   09/04/13 0529 09/04/13 1822 09/04/13 2101 09/05/13 0600  BP: 144/67 140/77 155/78 166/74  Pulse: 62 56 97 94  Temp: 97.9 F (36.6 C) 97.1 F (36.2 C)  98.4 F (36.9 C) 98.5 F (36.9 C)  TempSrc:  Axillary    Resp: 16 16 16 16   Height:      Weight:      SpO2: 98% 99% 96% 96%    Wt Readings from Last 3 Encounters:  08/30/13 47.9 kg (105 lb 9.6 oz)  08/30/13 47.9 kg (105 lb 9.6 oz)  08/24/13 49.896 kg (110 lb)     Intake/Output Summary (Last 24 hours) at 09/05/13 0942 Last data filed at 09/04/13 2300  Gross per 24 hour  Intake     30 ml  Output      4 ml  Net     26 ml     Physical Exam  Awake Alert, Oriented X 3, No new F.N deficits, Normal affect [place].AT,PERRAL Supple Neck,No JVD, No cervical lymphadenopathy appriciated.  Symmetrical Chest wall movement, Good air movement bilaterally, CTAB RRR,No Gallops,Rubs or new Murmurs, No Parasternal Heave +ve B.Sounds, Abd Soft, No tenderness, No organomegaly appriciated, No rebound - guarding or rigidity. No Cyanosis, Clubbing or edema, No new Rash or bruise , L shoulder in splint.   Data Review   Micro Results Recent Results (from the past 240 hour(s))  MRSA PCR SCREENING     Status: None   Collection Time    09/03/13  6:28 AM      Result Value Ref Range Status   MRSA by PCR NEGATIVE  NEGATIVE Final   Comment:            The GeneXpert MRSA Assay (FDA     approved for NASAL specimens     only), is one component of a     comprehensive MRSA colonization     surveillance program. It is not     intended to diagnose MRSA     infection nor to guide or     monitor treatment for     MRSA infections.    Radiology Reports  Dg Chest 2 View  08/24/2013   CLINICAL DATA:  Chest pain.  EXAM: CHEST  2 VIEW  COMPARISON:  08/09/2013 as well as left clavicle series 530 15.  FINDINGS: Patient is rotated to the left. Lungs are adequately inflated without focal consolidation or effusion. A metallic device overlies the left costophrenic angle. Cardiomediastinal silhouette is within normal. There is minimal calcified plaque over the thoracic aorta. No change in patient's known old left  humeral head/ neck deformity and no change in patient's known left shoulder dislocation. Old right humeral neck fracture. There is a mild-to-moderate compression deformity over the lower thoracic spine new since 2012 although age is indeterminate.  IMPRESSION: No acute cardiopulmonary disease.  Known dislocated left shoulder an old left humeral head/neck deformity.  Mild to moderate  lower thoracic spine compression fracture age indeterminate but new since 2012.   Electronically Signed   By: Marin Olp M.D.   On: 08/24/2013 18:14   Dg Abd 1 View  09/05/2013   CLINICAL DATA:  Shoulder surgery  EXAM: ABDOMEN - 1 VIEW  COMPARISON:  08/09/2013  FINDINGS: No dilated loops of large or small bowel. There is gas and stool in the rectum. Degenerative changes of the spine.  IMPRESSION: No evidence of bowel obstruction or ileus.   Electronically Signed   By: Suzy Bouchard M.D.   On: 09/05/2013 08:12   Ct Head Wo Contrast  08/30/2013   CLINICAL DATA:  Altered mental status with weakness.  EXAM: CT HEAD WITHOUT CONTRAST  TECHNIQUE: Contiguous axial images were obtained from the base of the skull through the vertex without contrast.  COMPARISON:  08/09/2013.  FINDINGS: Generalized atrophy. Chronic microvascular ischemic change. No evidence for acute infarction, hemorrhage, mass lesion, hydrocephalus, or extra-axial fluid. Vascular calcification. Calvarium intact. No sinus or mastoid air fluid levels. Similar appearance to priors.  IMPRESSION: No acute intracranial abnormality. Chronic changes as described, similar to priors.   Electronically Signed   By: Rolla Flatten M.D.   On: 08/30/2013 19:20   Dg Chest Portable 1 View  08/30/2013   CLINICAL DATA:  ALTERED MENTAL STATUS WEAKNESS  EXAM: PORTABLE CHEST - 1 VIEW  COMPARISON:  08/24/2013  FINDINGS: Comminuted left humerus fracture with dislocation of the glenohumeral joint.  Normal heart size.  Lungs hyper aerated and clear.  IMPRESSION: No active cardiopulmonary  disease.   Electronically Signed   By: Maryclare Bean M.D.   On: 08/30/2013 17:34      Dg Shoulder Left Port  09/03/2013   CLINICAL DATA:  Shoulder arthroplasty.  EXAM: PORTABLE LEFT SHOULDER - 2+ VIEW  COMPARISON:  08/31/2013.  FINDINGS: There is a new reverse shoulder or hemiarthroplasty on the left demonstrated on this single frontal view. Displacement of the greater tuberosity is present, measuring about 16 mm. Postsurgical changes are present in soft tissues.  IMPRESSION: New reverse left shoulder hemiarthroplasty.   Electronically Signed   By: Dereck Ligas M.D.   On: 09/03/2013 18:36       CBC  Recent Labs Lab 08/30/13 1700 08/30/13 1717 09/04/13 0713  WBC 8.4  --  8.9  HGB 10.8* 11.6* 9.4*  HCT 33.0* 34.0* 27.9*  PLT 209  --  198  MCV 94.3  --  93.0  MCH 30.9  --  31.3  MCHC 32.7  --  33.7  RDW 14.0  --  13.9  LYMPHSABS 1.3  --   --   MONOABS 0.7  --   --   EOSABS 0.1  --   --   BASOSABS 0.0  --   --     Chemistries   Recent Labs Lab 08/30/13 1700  09/01/13 1120 09/02/13 0530 09/03/13 0650 09/04/13 0713 09/05/13 0735  NA 138  < > 140 141 142 143 141  K 4.5  < > 4.2 3.9 3.7 4.0 3.5*  CL 99  < > 104 103 107 106 107  CO2 25  --  23 23 22 24 22   GLUCOSE 120*  < > 109* 97 97 129* 132*  BUN 29*  < > 17 15 14 16 12   CREATININE 1.33*  < > 0.96 1.09 1.16* 1.11* 0.93  CALCIUM 9.6  --  8.5 9.1 9.0 8.4 8.3*  AST 21  --   --   --   --   --   --  ALT 13  --   --   --   --   --   --   ALKPHOS 114  --   --   --   --   --   --   BILITOT 0.5  --   --   --   --   --   --   < > = values in this interval not displayed. ------------------------------------------------------------------------------------------------------------------ estimated creatinine clearance is 34.1 ml/min (by C-G formula based on Cr of 0.93). ------------------------------------------------------------------------------------------------------------------ No results found for this basename: HGBA1C,  in  the last 72 hours ------------------------------------------------------------------------------------------------------------------ No results found for this basename: CHOL, HDL, LDLCALC, TRIG, CHOLHDL, LDLDIRECT,  in the last 72 hours ------------------------------------------------------------------------------------------------------------------ No results found for this basename: TSH, T4TOTAL, FREET3, T3FREE, THYROIDAB,  in the last 72 hours ------------------------------------------------------------------------------------------------------------------ No results found for this basename: VITAMINB12, FOLATE, FERRITIN, TIBC, IRON, RETICCTPCT,  in the last 72 hours  Coagulation profile No results found for this basename: INR, PROTIME,  in the last 168 hours  No results found for this basename: DDIMER,  in the last 72 hours  Cardiac Enzymes No results found for this basename: CK, CKMB, TROPONINI, MYOGLOBIN,  in the last 168 hours ------------------------------------------------------------------------------------------------------------------ No components found with this basename: POCBNP,      Time Spent in minutes   35   Lala Lund K M.D on 09/05/2013 at 9:42 AM  Between 7am to 7pm - Pager - [phone]  After 7pm go to [email or web address] - password [email or web address]  And look for the night coverage person covering for me after hours  [facility] Office  [phone]   **Disclaimer: This note Hurst have been dictated with voice recognition software. Similar sounding words can inadvertently be transcribed and this note Hurst contain transcription errors which Hurst not have been corrected upon publication of note.**

## 2013-09-05 NOTE — Progress Notes (Signed)
Patient ID: Kerri Hurst, female   DOB: 1926-07-01, 80 y.o.   MRN: [id number]     Subjective:  Patient reports pain as mild to moderate.  Patient is alert and sitting up in bed.  Family at the bed side.  Objective:   VITALS:   Filed Vitals:   09/04/13 0529 09/04/13 1822 09/04/13 2101 09/05/13 0600  BP: 144/67 140/77 155/78 166/74  Pulse: 62 56 97 94  Temp: 97.9 F (36.6 C) 97.1 F (36.2 C) 98.4 F (36.9 C) 98.5 F (36.9 C)  TempSrc:  Axillary    Resp: 16 16 16 16   Height:      Weight:      SpO2: 98% 99% 96% 96%    ABD soft Incision: dressing C/D/I and no drainage Sensation decreased in the hand  Motor function decreased no thumb or wrist extension is able to flex the fingers and wrist.   Lab Results  Component Value Date   WBC 8.9 09/04/2013   HGB 9.4* 09/04/2013   HCT 27.9* 09/04/2013   MCV 93.0 09/04/2013   PLT 198 09/04/2013     Assessment/Plan: 2 Days Post-Op   Principal Problem:   Traumatic closed displaced fracture of left shoulder with anterior dislocation Active Problems:   Delirium due to general medical condition   Fracture dislocation of left shoulder joint   Orthostatic hypotension   Encephalopathy   Acute renal failure   Advance diet Up with therapy Continue plan per medicine Sling at all times Dry dressing PRN   Remonia Richter 09/05/2013, 8:22 AM   Edmonia Lynch MD [phone]

## 2013-09-06 LAB — POTASSIUM: Potassium: 3.3 mEq/L — ABNORMAL LOW (ref 3.7–5.3)

## 2013-09-06 MED ORDER — ENSURE COMPLETE PO LIQD: 237.0000 mL | Freq: Two times a day (BID) | ORAL | Status: DC

## 2013-09-06 MED ORDER — DSS 100 MG PO CAPS: 200.0000 mg | ORAL_CAPSULE | Freq: Two times a day (BID) | ORAL | Status: AC | PRN

## 2013-09-06 MED ORDER — PANTOPRAZOLE SODIUM 40 MG PO TBEC: 40.0000 mg | DELAYED_RELEASE_TABLET | Freq: Every day | ORAL | Status: DC

## 2013-09-06 MED ORDER — LORAZEPAM 0.5 MG PO TABS: 0.5000 mg | ORAL_TABLET | Freq: Three times a day (TID) | ORAL | Status: DC

## 2013-09-06 MED ORDER — ASPIRIN 81 MG PO TBEC: 81.0000 mg | DELAYED_RELEASE_TABLET | Freq: Every day | ORAL | Status: DC

## 2013-09-06 MED ORDER — HYDROCODONE-ACETAMINOPHEN 7.5-325 MG PO TABS: 1.0000 | ORAL_TABLET | Freq: Four times a day (QID) | ORAL | Status: DC | PRN

## 2013-09-06 MED ORDER — POTASSIUM CHLORIDE CRYS ER 20 MEQ PO TBCR
40.0000 meq | EXTENDED_RELEASE_TABLET | Freq: Once | ORAL | Status: AC
  Administered 2013-09-06: 40 meq via ORAL
  Filled 2013-09-06: qty 2

## 2013-09-06 MED ORDER — METOCLOPRAMIDE HCL 5 MG PO TABS: 5.0000 mg | ORAL_TABLET | Freq: Three times a day (TID) | ORAL | Status: DC

## 2013-09-06 MED ORDER — METOCLOPRAMIDE HCL 5 MG PO TABS
5.0000 mg | ORAL_TABLET | Freq: Three times a day (TID) | ORAL | Status: DC
  Administered 2013-09-06: 5 mg via ORAL
  Filled 2013-09-06 (×4): qty 1

## 2013-09-06 NOTE — Discharge Instructions (Signed)
Sling full time L arm, No weight bearing L arm. Dry dressing PRN   Follow with Primary MD MCFADDEN,Kerri C, MD in 7 days   Get CBC, CMP  checked  by Primary MD next visit.    Activity: As tolerated with Full fall precautions use walker/cane & assistance as needed   Disposition SNF   Diet: Dysphagia 3 - Heart Healthy with feeding assistance and aspiration precautions   For Heart failure patients - Check your Weight same time everyday, if you gain over 2 pounds, or you develop in leg swelling, experience more shortness of breath or chest pain, call your Primary MD immediately. Follow Cardiac Low Salt Diet and 1.8 lit/day fluid restriction.   On your next visit with her primary care physician please Get Medicines reviewed and adjusted.  Please request your Prim.MD to go over all Hospital Tests and Procedure/Radiological results at the follow up, please get all Hospital records sent to your Prim MD by signing hospital release before you go home.   If you experience worsening of your admission symptoms, develop shortness of breath, life threatening emergency, suicidal or homicidal thoughts you must seek medical attention immediately by calling 911 or calling your MD immediately  if symptoms less severe.  You Must read complete instructions/literature along with all the possible adverse reactions/side effects for all the Medicines you take and that have been prescribed to you. Take any new Medicines after you have completely understood and accpet all the possible adverse reactions/side effects.   Do not drive and provide baby sitting services if your were admitted for syncope or siezures until you have seen by Primary MD or a Neurologist and advised to do so again.  Do not drive when taking Pain medications.    Do not take more than prescribed Pain, Sleep and Anxiety Medications  Special Instructions: If you have smoked or chewed Tobacco  in the last 2 yrs please stop smoking, stop any  regular Alcohol  and or any Recreational drug use.  Wear Seat belts while driving.   Please note  You were cared for by a hospitalist during your hospital stay. If you have any questions about your discharge medications or the care you received while you were in the hospital after you are discharged, you can call the unit and asked to speak with the hospitalist on call if the hospitalist that took care of you is not available. Once you are discharged, your primary care physician will handle any further medical issues. Please note that NO REFILLS for any discharge medications will be authorized once you are discharged, as it is imperative that you return to your primary care physician (or establish a relationship with a primary care physician if you do not have one) for your aftercare needs so that they can reassess your need for medications and monitor your lab values.

## 2013-09-06 NOTE — Progress Notes (Signed)
Chaplain Note:  Patient and family requested explanation of materials to be completed prior to patient's surgery. Chaplain responded to patients request for Advanced Directive by reading through the information and answering any questions. Spiritual support was offered to the family with prayer. Chaplain offered time for family to discuss materials in private. At 11:15a, family requested a final review of the materials before the notary, which was completed within the next ten minutes.   Garnette Scheuermann, [place]

## 2013-09-06 NOTE — Progress Notes (Signed)
I participated in the care of this patient and agree with the above history, physical and evaluation. I performed a review of the history and a physical exam as detailed   Timothy Daniel Murphy MD  

## 2013-09-06 NOTE — Discharge Summary (Addendum)
Kerri Hurst, is a 80 y.o. female  DOB 12/17/1926  MRN [id number].  Admission date:  08/30/2013  Admitting Physician  [name], DO  Discharge Date:  09/06/2013   Primary MD  Houston Siren, MD  Recommendations for primary care physician for things to follow:   Repeat CBC BMP in 3 days, monitor for any side effects from oral Reglan.   Admission Diagnosis  Orthostatic hypotension [458.0] Delirium due to general medical condition [293.0] Traumatic closed displaced fracture of left shoulder with anterior dislocation [812.00] Fracture dislocation of left shoulder joint [812.00]   Discharge Diagnosis  Orthostatic hypotension [458.0] Delirium due to general medical condition [293.0] Traumatic closed displaced fracture of left shoulder with anterior dislocation [812.00] Fracture dislocation of left shoulder joint [812.00]   Principal Problem:   Traumatic closed displaced fracture of left shoulder with anterior dislocation Active Problems:   Delirium due to general medical condition   Fracture dislocation of left shoulder joint   Orthostatic hypotension   Encephalopathy   Acute renal failure      Past Medical History  Diagnosis Date  . Arthritis   . Complication of anesthesia     " daughter states she cry's when she wakes up "  . Orthostatic hypotension 08/2013  . Chronic kidney disease 08/2013    acute renal /dehydration  . Cancer     skin ca on  head     Past Surgical History  Procedure Laterality Date  . Abdominal hysterectomy    . Cholecystectomy    . Bladder surgery    . Reverse shoulder arthroplasty Left 09/03/2013    Procedure: REVERSE SHOULDER ARTHROPLASTY;  Surgeon: Renette Butters, MD;  Location: [facility];  Service: Orthopedics;  Laterality: Left;     Discharge Condition: Stable   Follow  UP  Follow-up Information   Follow up with MURPHY, TIMOTHY, D, MD In 2 weeks.   Specialty:  Orthopedic Surgery   Contact information:   [address] [place] [address] [phone]       Follow up with [facility], MD. Schedule an appointment as soon as possible for a visit in 1 week.   Specialty:  Family Medicine   Contact information:   [address] [place] [address] [phone]         Discharge Instructions  and  Discharge Medications     Discharge Instructions   Discharge instructions    Complete by:  As directed   Sling full time L arm, No weight bearing L arm. Dry dressing PRN   Follow with Primary MD MCFADDEN,JOHN C, MD in 7 days   Get CBC, CMP  checked  by Primary MD next visit.    Activity: As tolerated with Full fall precautions use walker/cane & assistance as needed   Disposition SNF   Diet: Dysphagia 3 - Heart Healthy with feeding assistance and aspiration precautions   For Heart failure patients - Check your Weight same time everyday, if you gain over 2 pounds,  or you develop in leg swelling, experience more shortness of breath or chest pain, call your Primary MD immediately. Follow Cardiac Low Salt Diet and 1.8 lit/day fluid restriction.   On your next visit with her primary care physician please Get Medicines reviewed and adjusted.  Please request your Prim.MD to go over all Hospital Tests and Procedure/Radiological results at the follow up, please get all Hospital records sent to your Prim MD by signing hospital release before you go home.   If you experience worsening of your admission symptoms, develop shortness of breath, life threatening emergency, suicidal or homicidal thoughts you must seek medical attention immediately by calling 911 or calling your MD immediately  if symptoms less severe.  You Must read complete instructions/literature along with all the possible adverse reactions/side effects for all the  Medicines you take and that have been prescribed to you. Take any new Medicines after you have completely understood and accpet all the possible adverse reactions/side effects.   Do not drive and provide baby sitting services if your were admitted for syncope or siezures until you have seen by Primary MD or a Neurologist and advised to do so again.  Do not drive when taking Pain medications.    Do not take more than prescribed Pain, Sleep and Anxiety Medications  Special Instructions: If you have smoked or chewed Tobacco  in the last 2 yrs please stop smoking, stop any regular Alcohol  and or any Recreational drug use.  Wear Seat belts while driving.   Please note  You were cared for by a hospitalist during your hospital stay. If you have any questions about your discharge medications or the care you received while you were in the hospital after you are discharged, you can call the unit and asked to speak with the hospitalist on call if the hospitalist that took care of you is not available. Once you are discharged, your primary care physician will handle any further medical issues. Please note that NO REFILLS for any discharge medications will be authorized once you are discharged, as it is imperative that you return to your primary care physician (or establish a relationship with a primary care physician if you do not have one) for your aftercare needs so that they can reassess your need for medications and monitor your lab values.  Sling full time L arm, No weight bearing L arm. Dry dressing PRN   Follow with Primary MD MCFADDEN,JOHN C, MD in 7 days   Get CBC, CMP  checked  by Primary MD next visit.    Activity: As tolerated with Full fall precautions use walker/cane & assistance as needed   Disposition SNF   Diet: Dysphagia 3 - Heart Healthy with feeding assistance and aspiration precautions   For Heart failure patients - Check your Weight same time everyday, if you gain over 2  pounds, or you develop in leg swelling, experience more shortness of breath or chest pain, call your Primary MD immediately. Follow Cardiac Low Salt Diet and 1.8 lit/day fluid restriction.   On your next visit with her primary care physician please Get Medicines reviewed and adjusted.  Please request your Prim.MD to go over all Hospital Tests and Procedure/Radiological results at the follow up, please get all Hospital records sent to your Prim MD by signing hospital release before you go home.   If you experience worsening of your admission symptoms, develop shortness of breath, life threatening emergency, suicidal or homicidal thoughts you must seek medical attention  immediately by calling 911 or calling your MD immediately  if symptoms less severe.  You Must read complete instructions/literature along with all the possible adverse reactions/side effects for all the Medicines you take and that have been prescribed to you. Take any new Medicines after you have completely understood and accpet all the possible adverse reactions/side effects.   Do not drive and provide baby sitting services if your were admitted for syncope or siezures until you have seen by Primary MD or a Neurologist and advised to do so again.  Do not drive when taking Pain medications.    Do not take more than prescribed Pain, Sleep and Anxiety Medications  Special Instructions: If you have smoked or chewed Tobacco  in the last 2 yrs please stop smoking, stop any regular Alcohol  and or any Recreational drug use.  Wear Seat belts while driving.   Please note  You were cared for by a hospitalist during your hospital stay. If you have any questions about your discharge medications or the care you received while you were in the hospital after you are discharged, you can call the unit and asked to speak with the hospitalist on call if the hospitalist that took care of you is not available. Once you are discharged, your  primary care physician will handle any further medical issues. Please note that NO REFILLS for any discharge medications will be authorized once you are discharged, as it is imperative that you return to your primary care physician (or establish a relationship with a primary care physician if you do not have one) for your aftercare needs so that they can reassess your need for medications and monitor your lab values.     Increase activity slowly    Complete by:  As directed             Medication List    STOP taking these medications       HYDROmorphone 4 MG tablet  Commonly known as:  DILAUDID      TAKE these medications       aspirin 81 MG EC tablet  Take 1 tablet (81 mg total) by mouth daily.     beta carotene w/minerals tablet  Take 1 tablet by mouth 2 (two) times daily.     DSS 100 MG Caps  Take 200 mg by mouth 2 (two) times daily as needed for mild constipation.     feeding supplement (ENSURE COMPLETE) Liqd  Take 237 mLs by mouth 2 (two) times daily between meals.     HYDROcodone-acetaminophen 7.5-325 MG per tablet  Commonly known as:  NORCO  Take 1 tablet by mouth every 6 (six) hours as needed for moderate pain.     LORazepam 0.5 MG tablet  Commonly known as:  ATIVAN  Take 1 tablet (0.5 mg total) by mouth every 8 (eight) hours.     metoCLOPramide 5 MG tablet  Commonly known as:  REGLAN  Take 1 tablet (5 mg total) by mouth 3 (three) times daily before meals.     multivitamin animal shapes (with Ca/FA) WITH C & FA Chew chewable tablet  Chew 1 tablet by mouth 2 (two) times daily.     pantoprazole 40 MG tablet  Commonly known as:  PROTONIX  Take 1 tablet (40 mg total) by mouth daily.     pravastatin 20 MG tablet  Commonly known as:  PRAVACHOL  Take 20 mg by mouth every morning.  Diet and Activity recommendation: See Discharge Instructions above   Consults obtained - Ortho   Major procedures and Radiology Reports - PLEASE review detailed and  final reports for all details, in brief -      Dg Chest 2 View  08/24/2013   CLINICAL DATA:  Chest pain.  EXAM: CHEST  2 VIEW  COMPARISON:  08/09/2013 as well as left clavicle series 530 15.  FINDINGS: Patient is rotated to the left. Lungs are adequately inflated without focal consolidation or effusion. A metallic device overlies the left costophrenic angle. Cardiomediastinal silhouette is within normal. There is minimal calcified plaque over the thoracic aorta. No change in patient's known old left humeral head/ neck deformity and no change in patient's known left shoulder dislocation. Old right humeral neck fracture. There is a mild-to-moderate compression deformity over the lower thoracic spine new since 2012 although age is indeterminate.  IMPRESSION: No acute cardiopulmonary disease.  Known dislocated left shoulder an old left humeral head/neck deformity.  Mild to moderate lower thoracic spine compression fracture age indeterminate but new since 2012.   Electronically Signed   By: Marin Olp M.D.   On: 08/24/2013 18:14   Dg Abd 1 View  09/05/2013   CLINICAL DATA:  Shoulder surgery  EXAM: ABDOMEN - 1 VIEW  COMPARISON:  08/09/2013  FINDINGS: No dilated loops of large or small bowel. There is gas and stool in the rectum. Degenerative changes of the spine.  IMPRESSION: No evidence of bowel obstruction or ileus.   Electronically Signed   By: Suzy Bouchard M.D.   On: 09/05/2013 08:12   Ct Head Wo Contrast  08/30/2013   CLINICAL DATA:  Altered mental status with weakness.  EXAM: CT HEAD WITHOUT CONTRAST  TECHNIQUE: Contiguous axial images were obtained from the base of the skull through the vertex without contrast.  COMPARISON:  08/09/2013.  FINDINGS: Generalized atrophy. Chronic microvascular ischemic change. No evidence for acute infarction, hemorrhage, mass lesion, hydrocephalus, or extra-axial fluid. Vascular calcification. Calvarium intact. No sinus or mastoid air fluid levels. Similar appearance  to priors.  IMPRESSION: No acute intracranial abnormality. Chronic changes as described, similar to priors.   Electronically Signed   By: Rolla Flatten M.D.   On: 08/30/2013 19:20   Dg Esophagus  09/05/2013   CLINICAL DATA:  Dysphagia. Globus sensation. Decreased oral intake.  EXAM: ESOPHOGRAM/BARIUM SWALLOW  TECHNIQUE: Single contrast examination was performed using  thin barium liquid.  FLUOROSCOPY TIME:  21 seconds  COMPARISON:  Chest x-ray 08/30/2013  FINDINGS: Scout fluoroscopic image demonstrates a lower thoracic wedge compression fracture. The patient is kyphotic and has a shoulder sling, limiting patient positioning. Entire study is performed in LPO positioning.  Single-contrast study demonstrates poor forward progression of the contrast bolus secondary to multiple tertiary contractions. However, no stricture or mass is identified. Gastroesophageal junction opens widely. The visualized gastric rugal fold pattern is normal.  IMPRESSION: 1. No stricture or mass. 2. Tertiary esophageal contractions and poor peristalsis.   Electronically Signed   By: Shon Hale M.D.   On: 09/05/2013 09:59   Dg Chest Portable 1 View  08/30/2013   CLINICAL DATA:  ALTERED MENTAL STATUS WEAKNESS  EXAM: PORTABLE CHEST - 1 VIEW  COMPARISON:  08/24/2013  FINDINGS: Comminuted left humerus fracture with dislocation of the glenohumeral joint.  Normal heart size.  Lungs hyper aerated and clear.  IMPRESSION: No active cardiopulmonary disease.   Electronically Signed   By: Maryclare Bean M.D.   On: 08/30/2013 17:34  Dg Shoulder Left  08/31/2013   CLINICAL DATA:  Proximal humerus fracture.  EXAM: LEFT SHOULDER - 2+ VIEW  COMPARISON:  08/30/2013  FINDINGS: Again noted is a displaced fracture involving the proximal left humerus that appears to be chronic. There continues to be anterior and inferior dislocation of the left shoulder. Visualized left ribs are intact without a large pneumothorax. The left AC joint is intact.  IMPRESSION:  Stable dislocation of the left shoulder.   Electronically Signed   By: Markus Daft M.D.   On: 08/31/2013 09:00   Dg Shoulder Left Port  09/03/2013   CLINICAL DATA:  Shoulder arthroplasty.  EXAM: PORTABLE LEFT SHOULDER - 2+ VIEW  COMPARISON:  08/31/2013.  FINDINGS: There is a new reverse shoulder or hemiarthroplasty on the left demonstrated on this single frontal view. Displacement of the greater tuberosity is present, measuring about 16 mm. Postsurgical changes are present in soft tissues.  IMPRESSION: New reverse left shoulder hemiarthroplasty.   Electronically Signed   By: Dereck Ligas M.D.   On: 09/03/2013 18:36   Dg Shoulder Left Port  08/30/2013   CLINICAL DATA:  Shoulder pain  EXAM: PORTABLE LEFT SHOULDER - 2+ VIEW  COMPARISON:  08/18/2013  FINDINGS: There is again noted comminuted fracture of the proximal left humerus with inferior dislocation. The overall appearance is stable from the prior exam. No new focal abnormality is seen.  IMPRESSION: No significant interval change from the previous study   Electronically Signed   By: Inez Catalina M.D.   On: 08/30/2013 22:04    Micro Results     Recent Results (from the past 240 hour(s))  MRSA PCR SCREENING     Status: None   Collection Time    09/03/13  6:28 AM      Result Value Ref Range Status   MRSA by PCR NEGATIVE  NEGATIVE Final   Comment:            The GeneXpert MRSA Assay (FDA     approved for NASAL specimens     only), is one component of a     comprehensive MRSA colonization     surveillance program. It is not     intended to diagnose MRSA     infection nor to guide or     monitor treatment for     MRSA infections.     History of present illness and  Hospital Course:     Kindly see H&P for history of present illness and admission details, please review complete Labs, Consult reports and Test reports for all details in brief Kerri Hurst, is a 80 y.o. female, patient with history of arthritis, chronic pain, esophageal  stricture and dysmotility was admitted to the hospital after she showed failure to thrive and inability to get up by herself after sustaining a fall about a month ago after which she dislocated her left shoulder, she was being monitored by orthopedics outpatient, after reviewing the hospital it was decided that patient will be best served with a surgical correction which she underwent this hospitalization.      1. Traumatic closed comminuted fracture left shoulder with dislocation. s/p L shoulder arthoplasty on 6/15, improved pain, SNF placement, keep left arm in sling at all times with no weight bearing in the left arm. Follow with orthopedics in 2 weeks.    2. Acute renal failure, dehydration. Resolved with IV fluids. Likely secondary to dehydration.      3. Subacute metabolic encephalopathy. Secondary to narcotics  related to pain management of left shoulder injury. CT head no acute abnormality. Chest x-ray no acute disease. Urinalysis unremarkable. Remains afebrile. This problem is resolved and she is now close to baseline.     4. Orthostatic hypotension, appears to be a subacute issues (see neurology consultation at [facility] in care everywhere). - Blood pressure is stable at this time continue to monitor.       5. ? Atrial fibrillation rapid ventricular response was noted in the chart by emergency department RN, however EKG on admission and repeat EKG 6/12 showed sinus rhythm. Dr. Sarajane Jews discussed with her cardiologist who reports no evidence of atrial fibrillation in studies that he is aware of. No recent echocardiogram on file but the patient has no history of cardiac disease. Added ASA, outpatient followup with her primary cardiologist post discharge in 1 week will be appropriate, remains in sinus  .                    6. Dysphagia. Has history of esophageal stricture in the past, barium swallow showed evidence of mild esophageal dysmotility, also her poor oral intake was worsened  due to the severe constipation, is in place on dysphagia 3 diet                 with good response, she is eating much better, she is responded well to bowel regimen, I have added Reglan by mouth pre-meal to assist with esophageal dysmotility, however if there any signs of Reglan side effects this can be            withdrawn. Updated daughter personally on this. One time outpatient followup with GI in one to 2 weeks as appropriate.          7. Low potassium replaced. Recheck in 3 days.    Today   Subjective:   Kerri Hurst today has no headache,no chest abdominal pain,no new weakness tingling or numbness, feels much better.  Objective:   Blood pressure 142/60, pulse 79, temperature 98.3 F (36.8 C), temperature source Oral, resp. rate 18, height 5\' 3"  (1.6 m), weight 47.9 kg (105 lb 9.6 oz), SpO2 94.00%.   Intake/Output Summary (Last 24 hours) at 09/06/13 0828 Last data filed at 09/06/13 0659  Gross per 24 hour  Intake    755 ml  Output      3 ml  Net    752 ml    Exam Awake Alert, Oriented x 3, No new F.N deficits, Normal affect Ruby.AT,PERRAL Supple Neck,No JVD, No cervical lymphadenopathy appriciated.  Symmetrical Chest wall movement, Good air movement bilaterally, CTAB RRR,No Gallops,Rubs or new Murmurs, No Parasternal Heave +ve B.Sounds, Abd Soft, Non tender, No organomegaly appriciated, No rebound -guarding or rigidity. No Cyanosis, Clubbing or edema, No new Rash or bruise  Data Review   CBC w Diff: Lab Results  Component Value Date   WBC 8.9 09/04/2013   HGB 9.4* 09/04/2013   HCT 27.9* 09/04/2013   PLT 198 09/04/2013   LYMPHOPCT 16 08/30/2013   MONOPCT 8 08/30/2013   EOSPCT 1 08/30/2013   BASOPCT 0 08/30/2013    CMP: Lab Results  Component Value Date   NA 141 09/05/2013   K 3.5* 09/05/2013   CL 107 09/05/2013   CO2 22 09/05/2013   BUN 12 09/05/2013   CREATININE 0.93 09/05/2013   PROT 6.8 08/30/2013   ALBUMIN 3.2* 08/30/2013   BILITOT 0.5 08/30/2013   ALKPHOS 114  08/30/2013   AST 21  08/30/2013   ALT 13 08/30/2013  .   Total Time in preparing paper work, data evaluation and todays exam - 35 minutes  Thurnell Lose M.D on 09/06/2013 at 8:28 AM  [facility] Office  [phone]   **Disclaimer: This note may have been dictated with voice recognition software. Similar sounding words can inadvertently be transcribed and this note may contain transcription errors which may not have been corrected upon publication of note.**

## 2013-09-06 NOTE — Progress Notes (Signed)
INITIAL NUTRITION ASSESSMENT  DOCUMENTATION CODES Per approved criteria  -Severe malnutrition in the context of chronic illness   INTERVENTION: Ensure Complete po BID, each supplement provides 350 kcal and 13 grams of protein  NUTRITION DIAGNOSIS: Malnutrition related to chronic illness as evidenced by severe fat and muscle wasting.   Goal: Pt to meet >/= 90% of their estimated nutrition needs   Monitor:  Diet advancement, PO intake, supplement acceptance, weight, labs  Reason for Assessment: MD Consult  80 y.o. female  Admitting Dx: Traumatic closed displaced fracture of left shoulder with anterior dislocation  ASSESSMENT: Kerri Hurst is a 80 y.o. female who presents to the ED with decreased activity, PO intake, and inability to get out of bed. Symptoms have been going on to some degree since a fall back on 08/09/13. At that time she dislocated and fractured her L shoulder. Pt is s/p reverse shoulder arthroplasty 6/15.  Daughter at bedside and provides hx. Pt lives with son, and questions her brother's ability to care for pt. Pt plans to d/c to SNF. Pt has had poor intake for the last month due to pain. Pain is better controlled today. Per daughter pt had more of her breakfast that she had, however this is still very little.   Nutrition Focused Physical Exam:  Subcutaneous Fat:  Orbital Region: WNL Upper Arm Region: severe depeltion Thoracic and Lumbar Region: severe depletion  Muscle:  Temple Region: severe depletion Clavicle Bone Region: severe depletion Clavicle and Acromion Bone Region: severe depletion Scapular Bone Region: severe depletion Dorsal Hand: severe depletion Patellar Region: severe depletion Anterior Thigh Region: severe depletion Posterior Calf Region: severe depletion   Edema: present in left upper extremity   Height: Ht Readings from Last 1 Encounters:  08/30/13 5\' 3"  (1.6 m)    Weight: Wt Readings from Last 1 Encounters:  08/30/13 105  lb 9.6 oz (47.9 kg)    Ideal Body Weight: 52.2 kg   % Ideal Body Weight: 92%  Wt Readings from Last 10 Encounters:  08/30/13 105 lb 9.6 oz (47.9 kg)  08/30/13 105 lb 9.6 oz (47.9 kg)  08/24/13 110 lb (49.896 kg)    Usual Body Weight: 52.2 kg   % Usual Body Weight: 92%  BMI:  Body mass index is 18.71 kg/(m^2).  Estimated Nutritional Needs: Kcal: 1300-1500 Protein: 60-70 grams Fluid: > 1.5 L/day  Skin: left shoulder incision  Diet Order: Dysphagia 3 with Thin Liquids  EDUCATION NEEDS: -No education needs identified at this time   Intake/Output Summary (Last 24 hours) at 09/06/13 0829 Last data filed at 09/06/13 0659  Gross per 24 hour  Intake    755 ml  Output      3 ml  Net    752 ml    Last BM: 6/17   Labs:   Recent Labs Lab 09/03/13 0650 09/04/13 0713 09/05/13 0735  NA 142 143 141  K 3.7 4.0 3.5*  CL 107 106 107  CO2 22 24 22   BUN 14 16 12   CREATININE 1.16* 1.11* 0.93  CALCIUM 9.0 8.4 8.3*  GLUCOSE 97 129* 132*    CBG (last 3)  No results found for this basename: GLUCAP,  in the last 72 hours  Scheduled Meds: . aspirin EC  81 mg Oral Daily  . beta carotene w/minerals  1 tablet Oral BID  . bisacodyl  10 mg Rectal Daily  . docusate sodium  200 mg Oral BID  . enoxaparin (LOVENOX) injection  30 mg Subcutaneous Daily  .  feeding supplement (ENSURE COMPLETE)  237 mL Oral BID BM  . metoCLOPramide  5 mg Oral TID AC  . pantoprazole  40 mg Oral Daily  . pneumococcal 23 valent vaccine  0.5 mL Intramuscular Tomorrow-1000  . polyethylene glycol  17 g Oral Daily  . simvastatin  10 mg Oral q1800    Continuous Infusions:   Past Medical History  Diagnosis Date  . Arthritis   . Complication of anesthesia     " daughter states she cry's when she wakes up "  . Orthostatic hypotension 08/2013  . Chronic kidney disease 08/2013    acute renal /dehydration  . Cancer     skin ca on  head     Past Surgical History  Procedure Laterality Date  .  Abdominal hysterectomy    . Cholecystectomy    . Bladder surgery    . Reverse shoulder arthroplasty Left 09/03/2013    Procedure: REVERSE SHOULDER ARTHROPLASTY;  Surgeon: Renette Butters, MD;  Location: [facility];  Service: Orthopedics;  Laterality: Left;    Maylon Peppers RD, [place], [place] Pager [phone] After Hours Pager

## 2013-09-06 NOTE — Progress Notes (Signed)
Patient ID: Kerri Hurst, female   DOB: 1926-06-22, 80 y.o.   MRN: [id number]     Subjective:  Patient reports pain as mild to moderate.  Patient and daughter state that she was able to rest last evening and night.  Objective:   VITALS:   Filed Vitals:   09/05/13 0600 09/05/13 1400 09/05/13 2018 09/06/13 0431  BP: 166/74 140/68 167/71 142/60  Pulse: 94 98 89 79  Temp: 98.5 F (36.9 C) 98.1 F (36.7 C) 99.5 F (37.5 C) 98.3 F (36.8 C)  TempSrc:  Axillary Oral Oral  Resp: 16 20 18 18   Height:      Weight:      SpO2: 96% 98% 95% 94%    ABD soft Sensation intact distally Incision: dressing C/D/I and no drainage Patient has flexion of all fingers. Has maybe a flicker of extension of the thumb but no wrist extension.   Lab Results  Component Value Date   WBC 8.9 09/04/2013   HGB 9.4* 09/04/2013   HCT 27.9* 09/04/2013   MCV 93.0 09/04/2013   PLT 198 09/04/2013     Assessment/Plan: 3 Days Post-Op   Principal Problem:   Traumatic closed displaced fracture of left shoulder with anterior dislocation Active Problems:   Delirium due to general medical condition   Fracture dislocation of left shoulder joint   Orthostatic hypotension   Encephalopathy   Acute renal failure   Advance diet Up with therapy Continue plan per medicine Sling at all times NWB left upper ext   Remonia Richter 09/06/2013, 7:18 AM   Edmonia Lynch MD [phone]

## 2013-09-06 NOTE — Progress Notes (Signed)
Report called to [facility] at [facility]. She is being transported via non emergent transport after daughter signs paperwork at facility. Will let social worker know when patient is ready for discharge.

## 2013-09-07 NOTE — Progress Notes (Signed)
Chaplain Note:  Chaplain responded to patients request for an AD. Chaplain explained and reviewed the materials with patient and her daughter. Chaplain allowed time for the two to discuss and confirm their request. Chaplain prayed with family before leaving the room. Chaplain returned with Notary and witnesses to complete request. Request completed.  Garnette Scheuermann, [place]

## 2013-11-27 ENCOUNTER — Encounter (HOSPITAL_COMMUNITY): Payer: Self-pay | Admitting: Emergency Medicine

## 2013-11-27 ENCOUNTER — Inpatient Hospital Stay (HOSPITAL_COMMUNITY): Payer: Medicare Other

## 2013-11-27 ENCOUNTER — Emergency Department (HOSPITAL_COMMUNITY): Payer: Medicare Other

## 2013-11-27 ENCOUNTER — Inpatient Hospital Stay (HOSPITAL_COMMUNITY)
Admission: EM | Admit: 2013-11-27 | Discharge: 2013-11-30 | DRG: 312 | Disposition: A | Discharge status: Skilled Nursing Facility | Payer: Medicare Other | Attending: Internal Medicine | Admitting: Internal Medicine

## 2013-11-27 DIAGNOSIS — N183 Chronic kidney disease, stage 3 unspecified: Secondary | ICD-10-CM | POA: Diagnosis present

## 2013-11-27 DIAGNOSIS — N179 Acute kidney failure, unspecified: Secondary | ICD-10-CM | POA: Diagnosis present

## 2013-11-27 DIAGNOSIS — R64 Cachexia: Secondary | ICD-10-CM | POA: Diagnosis present

## 2013-11-27 DIAGNOSIS — R5381 Other malaise: Secondary | ICD-10-CM | POA: Diagnosis present

## 2013-11-27 DIAGNOSIS — R1310 Dysphagia, unspecified: Secondary | ICD-10-CM | POA: Diagnosis present

## 2013-11-27 DIAGNOSIS — Z79899 Other long term (current) drug therapy: Secondary | ICD-10-CM

## 2013-11-27 DIAGNOSIS — Z681 Body mass index (BMI) 19 or less, adult: Secondary | ICD-10-CM

## 2013-11-27 DIAGNOSIS — G9340 Encephalopathy, unspecified: Secondary | ICD-10-CM | POA: Diagnosis present

## 2013-11-27 DIAGNOSIS — Z85828 Personal history of other malignant neoplasm of skin: Secondary | ICD-10-CM | POA: Diagnosis not present

## 2013-11-27 DIAGNOSIS — I951 Orthostatic hypotension: Principal | ICD-10-CM | POA: Diagnosis present

## 2013-11-27 DIAGNOSIS — I480 Paroxysmal atrial fibrillation: Secondary | ICD-10-CM

## 2013-11-27 DIAGNOSIS — E46 Unspecified protein-calorie malnutrition: Secondary | ICD-10-CM | POA: Diagnosis present

## 2013-11-27 DIAGNOSIS — M129 Arthropathy, unspecified: Secondary | ICD-10-CM | POA: Diagnosis present

## 2013-11-27 DIAGNOSIS — R531 Weakness: Secondary | ICD-10-CM | POA: Diagnosis present

## 2013-11-27 DIAGNOSIS — F0390 Unspecified dementia without behavioral disturbance: Secondary | ICD-10-CM | POA: Diagnosis present

## 2013-11-27 DIAGNOSIS — I4891 Unspecified atrial fibrillation: Secondary | ICD-10-CM | POA: Diagnosis present

## 2013-11-27 DIAGNOSIS — R42 Dizziness and giddiness: Secondary | ICD-10-CM

## 2013-11-27 DIAGNOSIS — R29810 Facial weakness: Secondary | ICD-10-CM | POA: Diagnosis not present

## 2013-11-27 DIAGNOSIS — Z7982 Long term (current) use of aspirin: Secondary | ICD-10-CM | POA: Diagnosis not present

## 2013-11-27 HISTORY — DX: Cardiac arrhythmia, unspecified: I49.9

## 2013-11-27 HISTORY — DX: Chronic kidney disease, stage 3 (moderate): N18.3

## 2013-11-27 HISTORY — DX: Unspecified dementia, unspecified severity, without behavioral disturbance, psychotic disturbance, mood disturbance, and anxiety: F03.90

## 2013-11-27 HISTORY — DX: History of falling: Z91.81

## 2013-11-27 LAB — CBC
HEMATOCRIT: 38 % (ref 36.0–46.0)
HEMOGLOBIN: 13.1 g/dL (ref 12.0–15.0)
MCH: 31.4 pg (ref 26.0–34.0)
MCHC: 34.5 g/dL (ref 30.0–36.0)
MCV: 91.1 fL (ref 78.0–100.0)
Platelets: 185 10*3/uL (ref 150–400)
RBC: 4.17 MIL/uL (ref 3.87–5.11)
RDW: 13.4 % (ref 11.5–15.5)
WBC: 7.2 10*3/uL (ref 4.0–10.5)

## 2013-11-27 LAB — DIFFERENTIAL
BASOS PCT: 0 % (ref 0–1)
Basophils Absolute: 0 10*3/uL (ref 0.0–0.1)
Eosinophils Absolute: 0.2 10*3/uL (ref 0.0–0.7)
Eosinophils Relative: 3 % (ref 0–5)
LYMPHS ABS: 1.8 10*3/uL (ref 0.7–4.0)
LYMPHS PCT: 26 % (ref 12–46)
MONOS PCT: 7 % (ref 3–12)
Monocytes Absolute: 0.5 10*3/uL (ref 0.1–1.0)
NEUTROS ABS: 4.6 10*3/uL (ref 1.7–7.7)
Neutrophils Relative %: 64 % (ref 43–77)

## 2013-11-27 LAB — COMPREHENSIVE METABOLIC PANEL
ALBUMIN: 3.1 g/dL — AB (ref 3.5–5.2)
ALK PHOS: 51 U/L (ref 39–117)
ALT: 21 U/L (ref 0–35)
AST: 19 U/L (ref 0–37)
Anion gap: 11 (ref 5–15)
BUN: 42 mg/dL — AB (ref 6–23)
CO2: 24 meq/L (ref 19–32)
Calcium: 9.4 mg/dL (ref 8.4–10.5)
Chloride: 105 mEq/L (ref 96–112)
Creatinine, Ser: 1.43 mg/dL — ABNORMAL HIGH (ref 0.50–1.10)
GFR calc Af Amer: 38 mL/min — ABNORMAL LOW (ref >90)
GFR, EST NON AFRICAN AMERICAN: 33 mL/min — AB (ref >90)
Glucose, Bld: 94 mg/dL (ref 70–99)
POTASSIUM: 5.3 meq/L (ref 3.7–5.3)
Sodium: 140 mEq/L (ref 137–147)
Total Bilirubin: 0.3 mg/dL (ref 0.3–1.2)
Total Protein: 6.4 g/dL (ref 6.0–8.3)

## 2013-11-27 LAB — I-STAT CHEM 8, ED
BUN: 38 mg/dL — ABNORMAL HIGH (ref 6–23)
CALCIUM ION: 1.22 mmol/L (ref 1.13–1.30)
CREATININE: 1.5 mg/dL — AB (ref 0.50–1.10)
Chloride: 111 mEq/L (ref 96–112)
Glucose, Bld: 96 mg/dL (ref 70–99)
HCT: 39 % (ref 36.0–46.0)
Hemoglobin: 13.3 g/dL (ref 12.0–15.0)
Potassium: 4.9 mEq/L (ref 3.7–5.3)
Sodium: 139 mEq/L (ref 137–147)
TCO2: 26 mmol/L (ref 0–100)

## 2013-11-27 LAB — RAPID URINE DRUG SCREEN, HOSP PERFORMED
AMPHETAMINES: NOT DETECTED
Barbiturates: NOT DETECTED
Benzodiazepines: NOT DETECTED
Cocaine: NOT DETECTED
Opiates: NOT DETECTED
TETRAHYDROCANNABINOL: NOT DETECTED

## 2013-11-27 LAB — URINALYSIS, ROUTINE W REFLEX MICROSCOPIC
Bilirubin Urine: NEGATIVE
Glucose, UA: NEGATIVE mg/dL
Hgb urine dipstick: NEGATIVE
Ketones, ur: NEGATIVE mg/dL
LEUKOCYTES UA: NEGATIVE
Nitrite: NEGATIVE
PROTEIN: NEGATIVE mg/dL
SPECIFIC GRAVITY, URINE: 1.013 (ref 1.005–1.030)
UROBILINOGEN UA: 0.2 mg/dL (ref 0.0–1.0)
pH: 6.5 (ref 5.0–8.0)

## 2013-11-27 LAB — ETHANOL: Alcohol, Ethyl (B): <11 mg/dL (ref 0–11)

## 2013-11-27 LAB — I-STAT TROPONIN, ED: Troponin i, poc: 0 ng/mL (ref 0.00–0.08)

## 2013-11-27 LAB — APTT: APTT: 21 s — AB (ref 24–37)

## 2013-11-27 LAB — PROTIME-INR
INR: 1.12 (ref 0.00–1.49)
Prothrombin Time: 14.4 seconds (ref 11.6–15.2)

## 2013-11-27 LAB — MAGNESIUM: Magnesium: 1.8 mg/dL (ref 1.5–2.5)

## 2013-11-27 LAB — PHOSPHORUS: PHOSPHORUS: 3.1 mg/dL (ref 2.3–4.6)

## 2013-11-27 MED ORDER — SIMVASTATIN 10 MG PO TABS
10.0000 mg | ORAL_TABLET | Freq: Every day | ORAL | Status: DC
  Administered 2013-11-27 – 2013-11-29 (×3): 10 mg via ORAL
  Filled 2013-11-27 (×3): qty 1

## 2013-11-27 MED ORDER — ENSURE COMPLETE PO LIQD
237.0000 mL | Freq: Two times a day (BID) | ORAL | Status: DC
  Administered 2013-11-28 (×2): 237 mL via ORAL

## 2013-11-27 MED ORDER — MEGESTROL ACETATE 40 MG/ML PO SUSP
200.0000 mg | Freq: Every day | ORAL | Status: DC
  Administered 2013-11-27 – 2013-11-30 (×4): 200 mg via ORAL
  Filled 2013-11-27 (×7): qty 5

## 2013-11-27 MED ORDER — SODIUM CHLORIDE 0.9 % IV SOLN
INTRAVENOUS | Status: DC
  Administered 2013-11-27: 09:00:00 via INTRAVENOUS

## 2013-11-27 MED ORDER — ENOXAPARIN SODIUM 30 MG/0.3ML ~~LOC~~ SOLN
30.0000 mg | SUBCUTANEOUS | Status: DC
  Administered 2013-11-27: 30 mg via SUBCUTANEOUS

## 2013-11-27 MED ORDER — SODIUM CHLORIDE 0.9 % IV SOLN
INTRAVENOUS | Status: AC
  Administered 2013-11-27 (×4): via INTRAVENOUS

## 2013-11-27 MED ORDER — ACETAMINOPHEN-CODEINE #3 300-30 MG PO TABS: 0.5000 | ORAL_TABLET | ORAL | Status: DC | PRN

## 2013-11-27 MED ORDER — ENOXAPARIN SODIUM 30 MG/0.3ML ~~LOC~~ SOLN: 20.0000 mg | SUBCUTANEOUS | Status: DC

## 2013-11-27 MED ORDER — ASPIRIN EC 81 MG PO TBEC
81.0000 mg | DELAYED_RELEASE_TABLET | Freq: Every day | ORAL | Status: DC
  Administered 2013-11-28 – 2013-11-30 (×3): 81 mg via ORAL
  Filled 2013-11-27 (×3): qty 1

## 2013-11-27 MED ORDER — LORAZEPAM 0.5 MG PO TABS: 0.5000 mg | ORAL_TABLET | Freq: Once | ORAL | Status: AC | PRN

## 2013-11-27 MED ORDER — PANTOPRAZOLE SODIUM 40 MG PO TBEC
40.0000 mg | DELAYED_RELEASE_TABLET | Freq: Every day | ORAL | Status: DC
  Administered 2013-11-27 – 2013-11-30 (×4): 40 mg via ORAL
  Filled 2013-11-27 (×3): qty 1

## 2013-11-27 NOTE — H&P (Signed)
Internal Medicine Attending Admission Note Date: 11/27/2013  Patient name: Kerri Hurst Medical record number: [id number] Date of birth: May 21, 1926 Age: 80 y.o. Gender: female  I saw and evaluated the patient. I reviewed the resident's note and I agree with the resident's findings and plan as documented in the resident's note, with the following additional comments.  Chief Complaint(s): Episode of difficulty walking, slurred speech, and "drawing" of mouth  History - key components related to admission: Patient is an 80 year old woman with history of falls with a fracture dislocation of the left shoulder 08/2013 status post reverse shoulder arthroplasty by Dr. Percell Miller 09/03/2013, "staring spells" with periods of unresponsiveness evaluated at [facility] in June of this year and felt to be due to orthostatic hypotension, and other problems as outlined in the medical history, admitted with complaint (per her daughter) of an episode of difficulty walking, slurred speech, and "drawing" of her mouth.  Her daughter reports recurrent episodes of difficulty walking with the patient requiring support while walking.  Patient gives limited responses to questions, knows that she is in the hospital, voices no acute complaints.   Physical Exam - key components related to admission:  Filed Vitals:   11/27/13 1040 11/27/13 1054 11/27/13 1100 11/27/13 1224  BP: 83/47  131/64 144/84  Pulse: 69  116 72  Temp:  97.6 F (36.4 C)  97.8 F (36.6 C)  TempSrc:    Oral  Resp: 24  18 18   SpO2: 97%  99% 98%    General: Alert, no apparent distress Lungs: Clear Heart: Regular; no extra sounds or murmurs Abdomen: Bowel sounds present, soft, nontender Extremities: No edema; no pain on passive range of motion of knees or hips bilaterally; left arm appears contracted Neurologic: Alert, oriented to person and situation; cranial nerves appear intact; motor exam was limited by patient effort,  appears to have decreased lower extremity strength bilateral   Lab results:   Basic Metabolic Panel:  Recent Labs  11/27/13 0813 11/27/13 0823  NA 140 139  K 5.3 4.9  CL 105 111  CO2 24  --   GLUCOSE 94 96  BUN 42* 38*  CREATININE 1.43* 1.50*  CALCIUM 9.4  --     Liver Function Tests:  Recent Labs  11/27/13 0813  AST 19  ALT 21  ALKPHOS 51  BILITOT 0.3  PROT 6.4  ALBUMIN 3.1*     CBC:  Recent Labs  11/27/13 0813 11/27/13 0823  WBC 7.2  --   HGB 13.1 13.3  HCT 38.0 39.0  MCV 91.1  --   PLT 185  --     Recent Labs  11/27/13 0813  NEUTROABS 4.6  LYMPHSABS 1.8  MONOABS 0.5  EOSABS 0.2  BASOSABS 0.0     Coagulation:  Recent Labs  11/27/13 0813  INR 1.12    Urine Drug Screen: Drugs of Abuse     Component Value Date/Time   LABOPIA NONE DETECTED 11/27/2013 1057   COCAINSCRNUR NONE DETECTED 11/27/2013 1057   LABBENZ NONE DETECTED 11/27/2013 1057   AMPHETMU NONE DETECTED 11/27/2013 1057   THCU NONE DETECTED 11/27/2013 1057   LABBARB NONE DETECTED 11/27/2013 1057     Alcohol Level:  Recent Labs  11/27/13 0813  ETH <11    Urinalysis    Component Value Date/Time   COLORURINE YELLOW 11/27/2013 [place] 11/27/2013 1057   LABSPEC 1.013 11/27/2013 1057   PHURINE 6.5 11/27/2013 1057   GLUCOSEU NEGATIVE 11/27/2013 1057  HGBUR NEGATIVE 11/27/2013 1057   BILIRUBINUR NEGATIVE 11/27/2013 1057   KETONESUR NEGATIVE 11/27/2013 1057   PROTEINUR NEGATIVE 11/27/2013 1057   UROBILINOGEN 0.2 11/27/2013 1057   NITRITE NEGATIVE 11/27/2013 1057   LEUKOCYTESUR NEGATIVE 11/27/2013 1057     Imaging results:  Ct Head Wo Contrast  11/27/2013   CLINICAL DATA:  Code stroke.  Right facial droop  EXAM: CT HEAD WITHOUT CONTRAST  TECHNIQUE: Contiguous axial images were obtained from the base of the skull through the vertex without intravenous contrast.  COMPARISON:  CT 08/30/2013  FINDINGS: Mild atrophy. Chronic microvascular ischemic change in the white matter is stable  from the prior study.  Negative for acute infarct. Negative for hemorrhage or mass. Calvarium intact.  IMPRESSION: Atrophy and chronic microvascular ischemia.  No acute abnormality.  Critical Value/emergent results were called by telephone at the time of interpretation on 11/27/2013 at 8:39 am to Dr. Roland Rack , who verbally acknowledged these results.   Electronically Signed   By: Franchot Gallo M.D.   On: 11/27/2013 08:40    Other results: EKG: Atrial fibrillation; anteroseptal infarct, old; artifact   Assessment & Plan by Problem:  1.  Episode of difficulty walking, slurred speech, and drawing of mouth.  Symptoms are concerning for possible stroke or TIA.  However, patient had similar symptoms evaluated at [facility] earlier this year which were felt to be due to orthostatic hypotension.  Her daughter is concerned that the metoprolol may be a contributing factor.  Neurology consult was obtained.  Plan is MRI of brain; EEG; hold metoprolol, tizanidine, and metoclopramide; OT/PT consult.  2.  Orthostatic hypotension.  Likely a combination of volume depletion and medication effect.  Plan is careful IV volume replacement; hold possibly contributing medications in the acute setting; follow orthostatics.  3.  Atrial fibrillation.  Plan is monitor; would obtain outside records from patient's cardiologist; hold metoprolol in acute setting given orthostatic hypotension as noted above.  4.  Acute kidney injury.  Likely due to orthostatic hypotension/volume depletion.  Plan is careful IV volume replacement; follow renal function and in/outs.  5.  Other problems and plans as per the resident physician's note.

## 2013-11-27 NOTE — ED Provider Notes (Signed)
CSN: [id number]     Arrival date & time 11/27/13  2376 History   First MD Initiated Contact with Patient 11/27/13 0820     Chief Complaint  Patient presents with  . Code Stroke     (Consider location/radiation/quality/duration/timing/severity/associated sxs/prior Treatment) The history is provided by the patient and medical records.   This is an 80 y.o. F with PMH significant for orthostatic hypotension, CKD due to recurrent dehydration, AFIB,  presenting to the ED as a CODE STROKE.  Patient lives with her daughter, has been noted to have multiple episodes of weakness upon standing the past 5 days.  Daughter states upon standing she has some trembling and weakness of her legs where she will collapse into daughters arms. She has not fallen nor had any head trauma. States patient was put to bed around 10 PM last night as normal.  This morning daughter was trying to help her ambulate to the bathroom as she normally does, was found to have left-sided facial droop and speech difficulties.  Patient has no prior history of TIA, stroke, or seizure disorder. She is currently followed by neurology in [facility], questionable Parkinson's disease.  Patient was recently started on metoprolol for A. fib. She is taking daily aspirin, no other anticoagulants.  Past Medical History  Diagnosis Date  . Arthritis   . Complication of anesthesia     " daughter states she cry's when she wakes up "  . Orthostatic hypotension 08/2013  . Chronic kidney disease 08/2013    acute renal /dehydration  . Cancer     skin ca on  head    Past Surgical History  Procedure Laterality Date  . Abdominal hysterectomy    . Cholecystectomy    . Bladder surgery    . Reverse shoulder arthroplasty Left 09/03/2013    Procedure: REVERSE SHOULDER ARTHROPLASTY;  Surgeon: Renette Butters, MD;  Location: [facility];  Service: Orthopedics;  Laterality: Left;   No family history on file. History  Substance Use Topics  . Smoking status: Never  Smoker   . Smokeless tobacco: Never Used  . Alcohol Use: No   OB History   Grav Para Term Preterm Abortions TAB SAB Ect Mult Living                 Review of Systems  Neurological: Positive for facial asymmetry and weakness.  All other systems reviewed and are negative.     Allergies  Aricept; Boniva; Cymbalta; Levaquin; Oxybutynin chloride; Silver sulfadiazine; Tegaderm ag mesh; and Keflex  Home Medications   Prior to Admission medications   Medication Sig Start Date End Date Taking? Authorizing Provider  aspirin EC 81 MG EC tablet Take 1 tablet (81 mg total) by mouth daily. 09/06/13   Thurnell Lose, MD  beta carotene w/minerals (OCUVITE) tablet Take 1 tablet by mouth 2 (two) times daily.    Historical Provider, MD  docusate sodium 100 MG CAPS Take 200 mg by mouth 2 (two) times daily as needed for mild constipation. 09/06/13   Thurnell Lose, MD  feeding supplement, ENSURE COMPLETE, (ENSURE COMPLETE) LIQD Take 237 mLs by mouth 2 (two) times daily between meals. 09/06/13   Thurnell Lose, MD  HYDROcodone-acetaminophen (NORCO) 7.5-325 MG per tablet Take 1 tablet by mouth every 6 (six) hours as needed for moderate pain. 09/06/13   Thurnell Lose, MD  LORazepam (ATIVAN) 0.5 MG tablet Take 1 tablet (0.5 mg total) by mouth every 8 (eight) hours. 09/06/13  Thurnell Lose, MD  metoCLOPramide (REGLAN) 5 MG tablet Take 1 tablet (5 mg total) by mouth 3 (three) times daily before meals. 09/06/13   Thurnell Lose, MD  pantoprazole (PROTONIX) 40 MG tablet Take 1 tablet (40 mg total) by mouth daily. 09/06/13   Thurnell Lose, MD  Pediatric Multiple Vit-C-FA (MULTIVITAMIN ANIMAL SHAPES, WITH CA/FA,) WITH C & FA CHEW chewable tablet Chew 1 tablet by mouth 2 (two) times daily.    Historical Provider, MD  pravastatin (PRAVACHOL) 20 MG tablet Take 20 mg by mouth every morning.    Historical Provider, MD   BP 149/79  Pulse 67  Temp(Src) 98 F (36.7 C) (Oral)  Resp 21  SpO2 98%  Physical  Exam  Nursing note and vitals reviewed. Constitutional: She is oriented to person, place, and time. She appears well-developed and well-nourished.  HENT:  Head: Normocephalic and atraumatic.  Mouth/Throat: Oropharynx is clear and moist.  Dry mucous membranes  Eyes: Conjunctivae and EOM are normal. Pupils are equal, round, and reactive to light.  Neck: Normal range of motion.  Cardiovascular: Normal rate, regular rhythm and normal heart sounds.   Pulmonary/Chest: Effort normal and breath sounds normal. No respiratory distress. She has no wheezes.  Abdominal: Soft. Bowel sounds are normal. There is no tenderness. There is no guarding.  Musculoskeletal: Normal range of motion.  Neurological: She is alert and oriented to person, place, and time.  AAOx3, answering questions and following most commands appropriately; eyes will cross midline but will not fully gaze to the right; slight blunting of left naso-labial fold; limited movement of LUE due to surgery; moving right upper and lower extremities without ataxia  Skin: Skin is warm and dry.  Psychiatric: She has a normal mood and affect.  Speech soft but not slurred    ED Course  Procedures (including critical care time) Labs Review Labs Reviewed  APTT - Abnormal; Notable for the following:    aPTT 21 (*)    All other components within normal limits  COMPREHENSIVE METABOLIC PANEL - Abnormal; Notable for the following:    BUN 42 (*)    Creatinine, Ser 1.43 (*)    Albumin 3.1 (*)    GFR calc non Af Amer 33 (*)    GFR calc Af Amer 38 (*)    All other components within normal limits  I-STAT CHEM 8, ED - Abnormal; Notable for the following:    BUN 38 (*)    Creatinine, Ser 1.50 (*)    All other components within normal limits  ETHANOL  PROTIME-INR  CBC  DIFFERENTIAL  URINE RAPID DRUG SCREEN (HOSP PERFORMED)  URINALYSIS, ROUTINE W REFLEX MICROSCOPIC  I-STAT TROPOININ, ED  I-STAT TROPOININ, ED    Imaging Review Ct Head Wo  Contrast  11/27/2013   CLINICAL DATA:  Code stroke.  Right facial droop  EXAM: CT HEAD WITHOUT CONTRAST  TECHNIQUE: Contiguous axial images were obtained from the base of the skull through the vertex without intravenous contrast.  COMPARISON:  CT 08/30/2013  FINDINGS: Mild atrophy. Chronic microvascular ischemic change in the white matter is stable from the prior study.  Negative for acute infarct. Negative for hemorrhage or mass. Calvarium intact.  IMPRESSION: Atrophy and chronic microvascular ischemia.  No acute abnormality.  Critical Value/emergent results were called by telephone at the time of interpretation on 11/27/2013 at 8:39 am to Dr. Roland Rack , who verbally acknowledged these results.   Electronically Signed   By: Franchot Gallo M.D.  On: 11/27/2013 08:40     EKG Interpretation   Date/Time:  Tuesday November 27 2013 08:35:27 EDT Ventricular Rate:  62 PR Interval:  138 QRS Duration: 81 QT Interval:  403 QTC Calculation: 409 R Axis:   10 Text Interpretation:  Sinus rhythm Anteroseptal infarct, old Artifact No  significant change since last tracing Confirmed by [place]  ([id number]) on 11/27/2013 9:15:21 AM      MDM   Final diagnoses:  Weakness  Acute kidney injury   80 year old female arriving as a code stroke. Daughter discovered patient to have weakness and left-sided facial droop this morning. After further questioning, it appears that she's been having weakness upon standing for the past 5 days. This occurs routinely whenever she stands. Patient is alert and oriented. She does have some possible mild blunting of her left nasolabial fold. Her eyes will cross the midline, but she did not gaze fully to the right. Neurology has evaluated patient-- lower suspicion for stroke, sx possibly related to orthostatic hypotension as patient does have hx of this and was recently started on metoprolol for AFIB.  Initial head CT negative for acute findings. Will proceed with  stroke workup and admit to medicine service.  Neurology will follow along and provide recommendations.  EKG sinus rhythm without ischemic change. Troponin is negative. Lab work with BUN and SrCr elevated above baseline, patient does appear clinically dry on exam.  Have ordered gentle fluids.  U/a pending at this time.  Patient admitted to IM teaching service, telemetry.  Temp admission orders placed.  Larene Pickett, PA-C 11/27/13 1218

## 2013-11-27 NOTE — ED Notes (Signed)
Attempted to call report

## 2013-11-27 NOTE — ED Notes (Signed)
Per [facility] pt was discovered to have weakness and left side facial droop this morning by her daughter. She is alert and oriented on arrival. Speech is slightly quiet and unclear. BP 158 palpated with EMS, CBG 104.

## 2013-11-27 NOTE — Progress Notes (Signed)
Patient admitted this afternoon with stroke  Work up from the ED. Alert and oriented to self. Family at bedside. Oriented to room, call bell placed within reach. Placed on tele and made comfortable in bed.

## 2013-11-27 NOTE — Evaluation (Signed)
Clinical/Bedside Swallow Evaluation Patient Details  Name: Kerri Hurst MRN: [id number] Date of Birth: Jan 04, 1927  Today's Date: 11/27/2013 Time: 93-1400 SLP Time Calculation (min): 18 min  Past Medical History:  Past Medical History  Diagnosis Date  . Arthritis   . Complication of anesthesia     " daughter states she cry's when she wakes up "  . Orthostatic hypotension 08/2013  . Chronic kidney disease 08/2013    acute renal /dehydration  . Cancer     skin ca on  head    Past Surgical History:  Past Surgical History  Procedure Laterality Date  . Abdominal hysterectomy    . Cholecystectomy    . Bladder surgery    . Reverse shoulder arthroplasty Left 09/03/2013    Procedure: REVERSE SHOULDER ARTHROPLASTY;  Surgeon: Renette Butters, MD;  Location: [place];  Service: Orthopedics;  Laterality: Left;   HPI:  80 year old female admitted with recurrent episodes of decreased responsiveness after standing in the setting of known orthostasis.  Neurology suspects this represents dehydration with orthostasis rather than a true neurological event, but with increased tone in her left arm, possible left facial weakness and decreased vision to the right, other processes may be involved.  MRI is pending.  Pt was followed by SLP services during June admission, at which time she presented with normal oropharyngeal function but concerns for esophageal dysphagia.    Assessment / Plan / Recommendation Clinical Impression  Pt presents with normal oropharyngeal swallow with no focal CN deficits, sufficient attention to and mastication of boluses; reliable swallow response; no overt s/s of aspiration.  No s/s of esophageal deficits present today.  Pt follows commands, answers simple questions; limited spontaneity of verbal output.  Recommend resuming a regular diet with thin liquids; follow basic esophageal precautions.  No SLP f/u is recommended.      Aspiration Risk    mild   Diet Recommendation  Regular;Thin liquid   Liquid Administration via: Straw Medication Administration: Whole meds with liquid Supervision: Staff to assist with self feeding Compensations: Follow solids with liquid Postural Changes and/or Swallow Maneuvers: Seated upright 90 degrees;Upright 30-60 min after meal    Other  Recommendations Oral Care Recommendations: Oral care BID   Follow Up Recommendations  None     Swallow Study Prior Functional Status       General Date of Onset: 11/27/13 HPI: 80 year old female admitted with recurrent episodes of decreased responsiveness after standing in the setting of known orthostasis.  Neurology suspects this represents dehydration with orthostasis rather than a true neurological event, but with increased tone in her left arm, possible left facial weakness and decreased vision to the right, other processes may be involved.  MRI is pending.  Pt was followed by SLP services during June admission, at which time she presented with normal oropharyngeal function but concerns for esophageal dysphagia.  Type of Study: Bedside swallow evaluation Previous Swallow Assessment: [place] 2015 with recs for regular diet, thin liquids Diet Prior to this Study: NPO Temperature Spikes Noted: No Respiratory Status: Room air Behavior/Cognition: Alert Oral Cavity - Dentition: Adequate natural dentition Self-Feeding Abilities: Able to feed self Patient Positioning: Upright in bed Baseline Vocal Quality: Clear;Low vocal intensity Volitional Cough: Strong Volitional Swallow: Able to elicit    Oral/Motor/Sensory Function Overall Oral Motor/Sensory Function: Appears within functional limits for tasks assessed   Ice Chips Ice chips: Within functional limits   Thin Liquid Thin Liquid: Within functional limits Presentation: Straw    Nectar Thick Nectar Thick  Liquid: Not tested   Honey Thick Honey Thick Liquid: Not tested   Puree Puree: Within functional limits Presentation: Self Fed;Spoon    Solid  Kerri Hurst Kerri Hurst, [place] CCC/SLP Pager [phone]     Solid: Within functional limits Presentation: Self Fed       Kerri Hurst 11/27/2013,2:01 PM

## 2013-11-27 NOTE — Progress Notes (Signed)
PT Cancellation Note  Patient Details Name: Kerri Hurst MRN: [id number] DOB: Feb 25, 1927   Cancelled Treatment:    Reason Eval/Treat Not Completed:  (RN deferred and reports she just arrived from ED and to wait until tomorrow. PT to return as able.   Kingsley Callander 11/27/2013, 2:36 PM  Kittie Plater, PT, DPT Pager #: [phone] Office #: [phone]

## 2013-11-27 NOTE — Code Documentation (Signed)
81yo female arriving to [facility] at 0812 via [place].  EMS reports that the patient was found to have weakness and left facial droop upon awakening this morning.  Patient's daughter reports that she was helping her mother to the toilet when she noticed the symptoms.  Patient has been very lethargic for the last 5 days per her daughter.  She has not been eating/drinking well and has been difficult to arouse at times as well as constant c/o being tired.  Patient fell on 08/09/2013 while living at home with her son.  She now lives with her daughter.  She has neck problems requiring steroid injections in which her last was on 10/21/2013.  She has left sided weakness that is her baseline.  Patient's daughter reports that she has noticed the lethargy since patient started Metoprolol for her atrial fibrillation.  EMS activated Code Stroke.  Patient taken to CT on arrival.  NIHSS 11, see documentation for details and code stroke times.  Dr. Leonel Ramsay at bedside for assessment.  Patient is outside the window for treatment with tPA.  No acute stroke treatment at this time.  Code stroke canceled by Dr. Leonel Ramsay at [phone].  Bedside handoff with ED RN Jerene Pitch.

## 2013-11-27 NOTE — Progress Notes (Signed)
Date: 11/27/2013               Patient Name:  Kerri Hurst MRN: [id number]  DOB: 05/14/1926 Age / Sex: 80 y.o., female   PCP: Houston Siren, MD              Medical Service: Internal Medicine Teaching Service              Attending Physician: Dr. Axel Filler, MD    First Contact: Dr. Elsie Lincoln Pager: [phone]  Second Contact: Dr. Aundra Dubin Pager: [phone]            After Hours (After 5p/  First Contact Pager: [phone]  weekends / holidays): Second Contact Pager: [phone]   Chief Complaint: Facial droop, and general body weakness  History of Present Illness: History is mostly provided by the daughter. Patient is an 15 year old woman, with past medical history of dementia, atrial fibrillation, and dysphagia, who is brought by her daughter to the emergency department, with acute left facial droop and dysarthria, which she noticed this morning at 7 AM. The daughter states that while she was trying to give her drinking water, the patient appeared to have a difficult with swallowing, even though the water was actually going down. She reports that her mouth appeared "crooked", which prompted her to call the EMS due to concern of stroke. On arrival to the emergency department, patient was emergently evaluated by neurology (code stroke), who felt that acute stroke was less likely, based on further history and physical exam. However, patient's daughter states that over the past several weeks, patient has been weak with inability to walk without support. Her legs have been shaky, and she has been unable even to walk for a few across the room. She feels fatigues most of the day and spends a lot of time taking naps. Patient is dependent on the daughter for all ADLs, except feeding. Family denies history of fevers, chills, nausea, vomiting, or any other constitutional symptoms. The patient has herself does not endorse current pain.  Review of Systems: A complete Review of the systems is difficult to  obtain, due to patient's dementia. Constitutional: Denies fever, chills, diaphoresis, appetite change and fatigue.   Respiratory: Denies SOB, DOE, cough, chest tightness, and wheezing.  Cardiovascular: Denies chest pain, palpitations and leg swelling.  Gastrointestinal: Denies nausea, vomiting, abdominal pain, diarrhea, constipation, Genitourinary: Denies dysuria, urgency, frequency, hematuria, flank pain and difficulty urinating.  Musculoskeletal: Patient has pain in the left shoulder following her recent surgery for fractures.  Skin: Denies pallor, rash and wound.  Neurological: Denies dizziness, seizures. No loss of consciousness Hematological: Denies adenopathy. Easy bruising, personal or family bleeding history   Meds: Current Facility-Administered Medications  Medication Dose Route Frequency Provider Last Rate Last Dose  . 0.9 %  sodium chloride infusion   Intravenous Continuous Larene Pickett, PA-C 200 mL/hr at 11/27/13 2595     Current Outpatient Prescriptions  Medication Sig Dispense Refill  . acetaminophen-codeine (TYLENOL #3) 300-30 MG per tablet Take 0.5 tablets by mouth every 4 (four) hours as needed for moderate pain.      . Ascorbic Acid (VITAMIN C PO) Take 1 tablet by mouth 2 (two) times daily.      Marland Kitchen aspirin EC 81 MG EC tablet Take 1 tablet (81 mg total) by mouth daily.      Marland Kitchen dicyclomine (BENTYL) 10 MG capsule Take 10 mg by mouth 4 (four) times daily -  before meals and at bedtime.      Marland Kitchen  docusate sodium 100 MG CAPS Take 200 mg by mouth 2 (two) times daily as needed for mild constipation.  10 capsule  0  . lactose free nutrition (BOOST) LIQD Take 237 mLs by mouth 3 (three) times daily between meals.      . megestrol (MEGACE) 40 MG/ML suspension Take 200 mg by mouth daily.      . metoCLOPramide (REGLAN) 5 MG tablet Take 1 tablet (5 mg total) by mouth 3 (three) times daily before meals.      . metoprolol tartrate (LOPRESSOR) 25 MG tablet Take 25 mg by mouth 2 (two) times  daily.      . mometasone (NASONEX) 50 MCG/ACT nasal spray Place 2 sprays into the nose daily as needed (allergies).      . Multiple Vitamins-Minerals (MULTI ADULT GUMMIES PO) Take 1 capsule by mouth 2 (two) times daily.      . pantoprazole (PROTONIX) 40 MG tablet Take 1 tablet (40 mg total) by mouth daily.      . pravastatin (PRAVACHOL) 20 MG tablet Take 20 mg by mouth every morning.      Marland Kitchen tiZANidine (ZANAFLEX) 2 MG tablet Take 1-2 mg by mouth at bedtime.      Marland Kitchen VITAMIN D, ERGOCALCIFEROL, PO Take 1 tablet by mouth 2 (two) times daily.        Allergies: Allergies as of 11/27/2013 - Review Complete 11/27/2013  Allergen Reaction Noted  . Aricept [donepezil hcl] Other (See Comments) 08/30/2013  . Boniva [ibandronic acid] Other (See Comments) 08/30/2013  . Cymbalta [duloxetine hcl] Other (See Comments) 08/30/2013  . Levaquin [levofloxacin]  08/24/2013  . Oxybutynin chloride  08/30/2013  . Sertraline hcl  11/27/2013  . Silver sulfadiazine  08/30/2013  . Tegaderm ag mesh [silver]  08/30/2013  . Tramadol  11/27/2013  . Xanax [alprazolam]  11/27/2013  . Keflex [cephalexin] Rash 08/24/2013   Past Medical History  Diagnosis Date  . Arthritis   . Complication of anesthesia     " daughter states she cry's when she wakes up "  . Orthostatic hypotension 08/2013  . Chronic kidney disease 08/2013    acute renal /dehydration  . Cancer     skin ca on  head    Past Surgical History  Procedure Laterality Date  . Abdominal hysterectomy    . Cholecystectomy    . Bladder surgery    . Reverse shoulder arthroplasty Left 09/03/2013    Procedure: REVERSE SHOULDER ARTHROPLASTY;  Surgeon: Renette Butters, MD;  Location: [facility];  Service: Orthopedics;  Laterality: Left;   History reviewed. No pertinent family history. History   Social History  . Marital Status: Widowed    Spouse Name: N/A    Number of Children: N/A  . Years of Education: N/A   Occupational History  . Not on file.   Social  History Main Topics  . Smoking status: Never Smoker   . Smokeless tobacco: Never Used  . Alcohol Use: No  . Drug Use: No  . Sexual Activity: Not on file   Other Topics Concern  . Not on file   Social History Narrative  . No narrative on file   Physical Exam: Blood pressure 83/47, pulse 69, temperature 97.6 F (36.4 C), temperature source Oral, resp. rate 24, SpO2 97.00%.  General: Cachectic, elderly woman who appears fatigued, otherwise cooperative with exam Head: atraumatic, normocephalic,  Eye: pupils equal, round and reactive; sclera anicteric; normal conjunctiva  Neck: supple Lungs/Chest wall: clear to auscultation bilaterally, normal work  of breathing  Heart: Irregular heart rate, no murmurs Pulses: radial and dorsalis pedis pulses are 2+ and symmetric  Abdomen: Normal fullness, no rebound, guarding, or rigidity; normal bowel sounds; no masses or organomegaly  Skin: Widespread ecchymosis on the lower extremities. A small keratotic lesion on the anterior neck. Nontender Extremities: no peripheral edema, clubbing, or cyanosis Neurologic: Patient is unable to participate in the complete neurologic exam. She is aware that she is in the hospital, but she doesn't know the city. CN II - XII are grossly intact. Normal extraocular eye movements. Pupils are equal, round, and reactive to light. She moves all 3 extremities, but unable to provide good strength to test for power, Sensations intact to light touch, Cerebellar signs negative > not checked due to patient's condition.   Lab results: Basic Metabolic Panel:  Recent Labs  11/27/13 0813 11/27/13 0823  NA 140 139  K 5.3 4.9  CL 105 111  CO2 24  --   GLUCOSE 94 96  BUN 42* 38*  CREATININE 1.43* 1.50*  CALCIUM 9.4  --    Liver Function Tests:  Recent Labs  11/27/13 0813  AST 19  ALT 21  ALKPHOS 51  BILITOT 0.3  PROT 6.4  ALBUMIN 3.1*  CBC:  Recent Labs  11/27/13 0813 11/27/13 0823  WBC 7.2  --   NEUTROABS 4.6   --   HGB 13.1 13.3  HCT 38.0 39.0  MCV 91.1  --   PLT 185  --     Coagulation:  Recent Labs  11/27/13 0813  LABPROT 14.4  INR 1.12   Urine Drug Screen: Drugs of Abuse     Component Value Date/Time   LABOPIA POSITIVE* 08/30/2013 1820   COCAINSCRNUR NONE DETECTED 08/30/2013 1820   LABBENZ NONE DETECTED 08/30/2013 1820   AMPHETMU NONE DETECTED 08/30/2013 1820   THCU NONE DETECTED 08/30/2013 1820   LABBARB NONE DETECTED 08/30/2013 1820    Alcohol Level:  Recent Labs  11/27/13 0813  ETH <11   Urinalysis:  Recent Labs  11/27/13 1057  COLORURINE YELLOW  LABSPEC 1.013  PHURINE 6.5  GLUCOSEU NEGATIVE  HGBUR NEGATIVE  BILIRUBINUR NEGATIVE  KETONESUR NEGATIVE  PROTEINUR NEGATIVE  UROBILINOGEN 0.2  NITRITE NEGATIVE  LEUKOCYTESUR NEGATIVE    Imaging results:  Ct Head Wo Contrast  11/27/2013   CLINICAL DATA:  Code stroke.  Right facial droop  EXAM: CT HEAD WITHOUT CONTRAST  TECHNIQUE: Contiguous axial images were obtained from the base of the skull through the vertex without intravenous contrast.  COMPARISON:  CT 08/30/2013  FINDINGS: Mild atrophy. Chronic microvascular ischemic change in the white matter is stable from the prior study.  Negative for acute infarct. Negative for hemorrhage or mass. Calvarium intact.  IMPRESSION: Atrophy and chronic microvascular ischemia.  No acute abnormality.  Critical Value/emergent results were called by telephone at the time of interpretation on 11/27/2013 at 8:39 am to Dr. Roland Rack , who verbally acknowledged these results.   Electronically Signed   By: Franchot Gallo M.D.   On: 11/27/2013 08:40    Other results: EKG: atrial fibrillation, rate 50-65, Q waves in V1, V2 and V3.  Assessment & Plan by Problem: Principal Problem:   Orthostatic hypotension Active Problems:   Encephalopathy   Acute renal failure   Weakness   Atrial fibrillation   Dysphagia, idiopathic   Protein-energy malnutrition   General Weakness/dizzines  with ?Stroke: Multifactorial, including medication induced, orthostatic hypotension, and physical deconditioning. Patient is on metoclopramide, Zanaflex, and metoprolol,  which all can cause the stated symptoms. Initial presentation was concerning for stroke, however, patient was evaluated and there were no focal deficits. Neurology did not feel that the patient had any stroke. Initial report of left facial droop was not confirmed in the ED. Head CT scan without contrast revealed atrophy and chronic microvascular ischemia, but without acute abnormality. Neurology recommended brain MRI and EEG.  Plan. -Admit to telemetry. -PT and OT eval. -Hold metoprolol, Zanaflex and metoclopramide for now. -Will consult speech therapy before resumption of metoclopramide -Close monitoring for neuro symptoms -Brain MRI, and EEG per neurology - further evaluation for stroke  Orthostatic hypotension: Blood pressure dropped from 139/69 to 83/47 from lying to standing. Pulse rate changed from 83-55. According to her daughter, patient has been on metoprolol for at least a month and she was started on 50 mg twice a day before it was cut down to 25 mg twice a day. She follows up with a cardiologist in [facility]. -Will hold metoprolol, Zanaflex, and metoclopramide -Continue with slow IV infusion at 125 cc per hour -Keep on telemetry for rhythm monitoring -May consider evaluation by cardiology for recommendation on her atrial fibrillation treatment in the setting of disabling orthostatic hypotension  Atrial fibrillation: Heart rate between 55 and 65 on telemetry. Otherwise, EKG does not reveal any acute ischemic changes, and the noted Q waves are old. She has no other history of cardiac problems, like CHF or CAD. Her CHA2DS2-Vasc Score is 3, which predicts a 3% annual risk of ischemic stroke. Based on this, patient would ideally require to be on anticoagulation therapy. However, given her falls and general clinical  status, the risk and benefit of anticoagulation will need to further be discussed with her daughter. She currently takes aspirin. Initial troponin was negative. Plan. -Telemetry. - Continue with aspirin - Consider cardiology consultation - Repeat EKG in the morning  Acute renal injury: Admission, a creatinine of 1.5, up from 0.93 on 09/05/2013. BUN is also slightly increased from 12 to 42. Urinalysis was normal. Most likely acute renal injury is due to prerenal and poor by mouth intake. Plan. -Gentle IV rehydration -Monitor with BMP tomorrow -Likely renal function will recover with IV rehydration  Dementia: Patient is almost completely dependent on her daughter for activities of daily living except feeding.  Dysphagia: Patient apparently, has a history of dysphagia ?etiology. She takes metoclopramide, which helps with this. We'll consult speech therapy for recommendation since complications of side effects from metoclopramide could preclude its use. NPO until after SLP  VTE PPx: Lovenox  Code: Full code. Daughter is her healthcare power of attorney  Dispo: Disposition is deferred at this time, awaiting improvement of current medical problems. Anticipated discharge in approximately 2-3 day(s).   The patient does have a current PCP Houston Siren, MD), therefore is not require OPC follow-up after discharge.   The patient does not have transportation limitations that hinder transportation to clinic appointments.  Signed:  Jessee Avers, MD PGY-3 Internal Medicine Teaching Service Pager: [phone] (7pm-7am) 11/27/2013, 11:38 AM

## 2013-11-27 NOTE — Consult Note (Addendum)
Neurology Consultation Reason for Consult:  possible facial droop Referring Physician: Quincy Carnes  CC: Possible facial droop  History is obtained from: daughter  HPI: Kerri Hurst is a 80 y.o. female he has been feeling poorly for the past 5 days. This morning shortly after awakening, but over try to get the patient up to go to the bathroom, and after standing up the patient became unresponsive had some shaking of her extremities and coupled towards the floor. Afterwards, the daughter noticed that she had possible facial droop and therefore called 911.  It is unclear given that she was not up and out of bed prior to this she was truly normal prior to this event. She has been having repeated events very similar this with each time she stands. She was evaluated for this at [place] where she was found to be profoundly orthostatic with a blood pressure dropping from 130s to 80s with standing. She states that she never has these episodes of unresponsiveness while lying down, and they only happen when she stands.  Her daughter states that she does not eat or drink for a well and becomes dehydrated despite her best efforts.   LKW: Unclear tpa given?: no, unclear time of onset    ROS: A 14 point ROS was performed and is negative except as noted in the HPI.   Past Medical History  Diagnosis Date  . Arthritis   . Complication of anesthesia     " daughter states she cry's when she wakes up "  . Orthostatic hypotension 08/2013  . Chronic kidney disease 08/2013    acute renal /dehydration  . Cancer     skin ca on  head     Family History: No history of seizures  Social History: Tob: Denies  Exam: Current vital signs: BP 143/66  Pulse 66  Temp(Src) 98 F (36.7 C) (Oral)  Resp 18  SpO2 97% Vital signs in last 24 hours: Temp:  [98 F (36.7 C)] 98 F (36.7 C) (09/08 0840) Pulse Rate:  [66-67] 66 (09/08 0845) Resp:  [18-21] 18 (09/08 0845) BP: (143-149)/(66-79) 143/66  mmHg (09/08 0845) SpO2:  [97 %-98 %] 97 % (09/08 0845)  General: In bed, NAD CV: Regular in rhythm Mental Status: Patient is awake, alert, gives month as August, the correct age Immediate and remote memory are intact. Patient is able to give a clear and coherent history. No signs of aphasia or neglect Cranial Nerves: II: Visual Fields are full. Pupils are equal, round, and reactive to light.  Discs are difficult to visualize. III,IV, VI: Eyes will not fully look to the right.  V: Facial sensation is symmetric to temperature VII: Facial movement is notable for possible left facial weakness VIII: hearing is intact to voice X: Uvula elevates symmetrically XI: Shoulder shrug is symmetric. XII: tongue is midline without atrophy or fasciculations.  Motor: She has markedly increased tone in bilateral lower extremities. She is able to lift them, but is weak bilaterally. Her left arm with contracture vs increased tone.  Sensory: Sensation is symmetric to light touch and temperature in the arms and legs. Deep Tendon Reflexes: 2+ and symmetric in the biceps and patellae.  Plantars: Toes are downgoing bilaterally.  Cerebellar: Does not comply Gait: Not assessed due to acute nature of evaluation and multiple medical monitors in ED setting.   I have reviewed labs in epic and the results pertinent to this consultation are: Mildly elevated creatinine  I have reviewed the images obtained:  CT head no acute findings  Impression: 80 year old female with recurrent episodes of decreased responsiveness after standing in the setting of known orthostasis. Her elevated creatinine and BUN would be suggestive of blind ablation coupled with a history of poor by mouth intake over the past few days. I suspect this represents dehydration with orthostasis rather than a true neurological event, however the question of increased tone in her left arm as well as possible left facial weakness and decreased version  to the right do make me concerned that there might be another process going on.  Recommendations: 1) MRI brain 2) EEG 3) no stroke workup unless MRI brain is positive. 4) orthostatic vital signs  Roland Rack, MD [facility] [phone]  If 7pm- 7am, please page neurology on call as listed in [place].

## 2013-11-27 NOTE — ED Notes (Signed)
EKG completed

## 2013-11-27 NOTE — H&P (Signed)
Date: 11/27/2013               Patient Name:  Kerri Hurst MRN: [id number]  DOB: Jul 23, 1926 Age / Sex: 80 y.o., female   PCP: Houston Siren, MD              Medical Service: Internal Medicine Teaching Service              Attending Physician: Dr. Axel Filler, MD    First Contact: Dr. Elsie Lincoln Pager: [phone]  Second Contact: Dr. Aundra Dubin Pager: [phone]            After Hours (After 5p/  First Contact Pager: [phone]  weekends / holidays): Second Contact Pager: [phone]   Chief Complaint: Facial droop, and general body weakness  History of Present Illness: History is mostly provided by the daughter. Patient is an 80 year old woman, with past medical history of dementia, atrial fibrillation, and dysphagia, who is brought by her daughter to the emergency department, with acute left facial droop and dysarthria, which she noticed this morning at 7 AM. The daughter states that while she was trying to give her drinking water, the patient appeared to have a difficult with swallowing, even though the water was actually going down. She reports that her mouth appeared "crooked", which prompted her to call the EMS due to concern of stroke. On arrival to the emergency department, patient was emergently evaluated by neurology (code stroke), who felt that acute stroke was less likely, based on further history and physical exam. However, patient's daughter states that over the past several weeks, patient has been weak with inability to walk without support. Her legs have been shaky, and she has been unable even to walk for a few across the room. She feels fatigues most of the day and spends a lot of time taking naps. Patient is dependent on the daughter for all ADLs, except feeding. Family denies history of fevers, chills, nausea, vomiting, or any other constitutional symptoms. The patient has herself does not endorse current pain.  Review of Systems: A complete Review of the systems is difficult to  obtain, due to patient's dementia. Constitutional: Denies fever, chills, diaphoresis, appetite change and fatigue.   Respiratory: Denies SOB, DOE, cough, chest tightness, and wheezing.  Cardiovascular: Denies chest pain, palpitations and leg swelling.  Gastrointestinal: Denies nausea, vomiting, abdominal pain, diarrhea, constipation, Genitourinary: Denies dysuria, urgency, frequency, hematuria, flank pain and difficulty urinating.  Musculoskeletal: Patient has pain in the left shoulder following her recent surgery for fractures.  Skin: Denies pallor, rash and wound.  Neurological: Denies dizziness, seizures. No loss of consciousness Hematological: Denies adenopathy. Easy bruising, personal or family bleeding history   Meds: Current Facility-Administered Medications  Medication Dose Route Frequency Provider Last Rate Last Dose  . 0.9 %  sodium chloride infusion   Intravenous Continuous Cresenciano Genre, MD 100 mL/hr at 11/27/13 1341    . acetaminophen-codeine (TYLENOL #3) 300-30 MG per tablet 0.5 tablet  0.5 tablet Oral Q4H PRN Jessee Avers, MD      . Derrill Memo ON 11/28/2013] aspirin EC tablet 81 mg  81 mg Oral Daily Jessee Avers, MD      . enoxaparin (LOVENOX) injection 30 mg  30 mg Subcutaneous Q24H Jessee Avers, MD   30 mg at 11/27/13 1341  . megestrol (MEGACE) 40 MG/ML suspension 200 mg  200 mg Oral Daily Jessee Avers, MD      . pantoprazole (PROTONIX) EC tablet 40 mg  40 mg Oral Daily  Jessee Avers, MD      . simvastatin (ZOCOR) tablet 10 mg  10 mg Oral q1800 Jessee Avers, MD        Allergies: Allergies as of 11/27/2013 - Review Complete 11/27/2013  Allergen Reaction Noted  . Aricept [donepezil hcl] Other (See Comments) 08/30/2013  . Boniva [ibandronic acid] Other (See Comments) 08/30/2013  . Cymbalta [duloxetine hcl] Other (See Comments) 08/30/2013  . Levaquin [levofloxacin]  08/24/2013  . Oxybutynin chloride  08/30/2013  . Sertraline hcl  11/27/2013  . Silver  sulfadiazine  08/30/2013  . Tegaderm ag mesh [silver]  08/30/2013  . Tramadol  11/27/2013  . Xanax [alprazolam]  11/27/2013  . Keflex [cephalexin] Rash 08/24/2013   Past Medical History  Diagnosis Date  . Arthritis   . Complication of anesthesia     " daughter states she cry's when she wakes up "  . Orthostatic hypotension 08/2013  . Chronic kidney disease 08/2013    acute renal /dehydration  . Cancer     skin ca on  head    Past Surgical History  Procedure Laterality Date  . Abdominal hysterectomy    . Cholecystectomy    . Bladder surgery    . Reverse shoulder arthroplasty Left 09/03/2013    Procedure: REVERSE SHOULDER ARTHROPLASTY;  Surgeon: Renette Butters, MD;  Location: [facility];  Service: Orthopedics;  Laterality: Left;   History reviewed. No pertinent family history. History   Social History  . Marital Status: Widowed    Spouse Name: N/A    Number of Children: N/A  . Years of Education: N/A   Occupational History  . Not on file.   Social History Main Topics  . Smoking status: Never Smoker   . Smokeless tobacco: Never Used  . Alcohol Use: No  . Drug Use: No  . Sexual Activity: Not on file   Other Topics Concern  . Not on file   Social History Narrative  . No narrative on file   Physical Exam: Blood pressure 144/84, pulse 72, temperature 97.8 F (36.6 C), temperature source Oral, resp. rate 18, SpO2 98.00%.  General: Cachectic, elderly woman who appears fatigued, otherwise cooperative with exam Head: atraumatic, normocephalic,  Eye: pupils equal, round and reactive; sclera anicteric; normal conjunctiva  Neck: supple Lungs/Chest wall: clear to auscultation bilaterally, normal work of breathing  Heart: Irregular heart rate, no murmurs Pulses: radial and dorsalis pedis pulses are 2+ and symmetric  Abdomen: Normal fullness, no rebound, guarding, or rigidity; normal bowel sounds; no masses or organomegaly  Skin: Widespread ecchymosis on the lower extremities. A  small keratotic lesion on the anterior neck. Nontender Extremities: no peripheral edema, clubbing, or cyanosis Neurologic: Patient is unable to participate in the complete neurologic exam. She is aware that she is in the hospital, but she doesn't know the city. CN II - XII are grossly intact. Normal extraocular eye movements. Pupils are equal, round, and reactive to light. She moves all 3 extremities, but unable to provide good strength to test for power, Sensations intact to light touch, Cerebellar signs negative > not checked due to patient's condition.   Lab results: Basic Metabolic Panel:  Recent Labs  11/27/13 0813 11/27/13 0823  NA 140 139  K 5.3 4.9  CL 105 111  CO2 24  --   GLUCOSE 94 96  BUN 42* 38*  CREATININE 1.43* 1.50*  CALCIUM 9.4  --    Liver Function Tests:  Recent Labs  11/27/13 0813  AST 19  ALT  21  ALKPHOS 51  BILITOT 0.3  PROT 6.4  ALBUMIN 3.1*  CBC:  Recent Labs  11/27/13 0813 11/27/13 0823  WBC 7.2  --   NEUTROABS 4.6  --   HGB 13.1 13.3  HCT 38.0 39.0  MCV 91.1  --   PLT 185  --     Coagulation:  Recent Labs  11/27/13 0813  LABPROT 14.4  INR 1.12   Urine Drug Screen: Drugs of Abuse     Component Value Date/Time   LABOPIA NONE DETECTED 11/27/2013 1057   COCAINSCRNUR NONE DETECTED 11/27/2013 1057   LABBENZ NONE DETECTED 11/27/2013 1057   AMPHETMU NONE DETECTED 11/27/2013 1057   THCU NONE DETECTED 11/27/2013 1057   LABBARB NONE DETECTED 11/27/2013 1057    Alcohol Level:  Recent Labs  11/27/13 0813  ETH <11   Urinalysis:  Recent Labs  11/27/13 1057  COLORURINE YELLOW  LABSPEC 1.013  PHURINE 6.5  GLUCOSEU NEGATIVE  HGBUR NEGATIVE  BILIRUBINUR NEGATIVE  KETONESUR NEGATIVE  PROTEINUR NEGATIVE  UROBILINOGEN 0.2  NITRITE NEGATIVE  LEUKOCYTESUR NEGATIVE    Imaging results:  Ct Head Wo Contrast  11/27/2013   CLINICAL DATA:  Code stroke.  Right facial droop  EXAM: CT HEAD WITHOUT CONTRAST  TECHNIQUE: Contiguous axial images were  obtained from the base of the skull through the vertex without intravenous contrast.  COMPARISON:  CT 08/30/2013  FINDINGS: Mild atrophy. Chronic microvascular ischemic change in the white matter is stable from the prior study.  Negative for acute infarct. Negative for hemorrhage or mass. Calvarium intact.  IMPRESSION: Atrophy and chronic microvascular ischemia.  No acute abnormality.  Critical Value/emergent results were called by telephone at the time of interpretation on 11/27/2013 at 8:39 am to Dr. Roland Rack , who verbally acknowledged these results.   Electronically Signed   By: Franchot Gallo M.D.   On: 11/27/2013 08:40    Other results: EKG: atrial fibrillation, rate 50-65, Q waves in V1, V2 and V3.  Assessment & Plan by Problem: Principal Problem:   Orthostatic hypotension Active Problems:   Encephalopathy   Acute renal failure   Weakness   Atrial fibrillation   Dysphagia, idiopathic   Protein-energy malnutrition   General Weakness/dizzines with ?Stroke: Multifactorial, including medication induced, orthostatic hypotension, and physical deconditioning. Patient is on metoclopramide, Zanaflex, and metoprolol, which all can cause the stated symptoms. Initial presentation was concerning for stroke, however, patient was evaluated and there were no focal deficits. Neurology did not feel that the patient had any stroke. Initial report of left facial droop was not confirmed in the ED. Head CT scan without contrast revealed atrophy and chronic microvascular ischemia, but without acute abnormality. Neurology recommended brain MRI and EEG.  Plan. -Admit to telemetry. -PT and OT eval. -Hold metoprolol, Zanaflex and metoclopramide for now. -Will consult speech therapy before resumption of metoclopramide -Close monitoring for neuro symptoms -Brain MRI, and EEG per neurology - further evaluation for stroke  Orthostatic hypotension: Blood pressure dropped from 139/69 to 83/47 from lying to  standing. Pulse rate changed from 83-55. According to her daughter, patient has been on metoprolol for at least a month and she was started on 50 mg twice a day before it was cut down to 25 mg twice a day. She follows up with a cardiologist in [facility]. -Will hold metoprolol, Zanaflex, and metoclopramide -Continue with slow IV infusion at 125 cc per hour -Keep on telemetry for rhythm monitoring -May consider evaluation by cardiology for recommendation on  her atrial fibrillation treatment in the setting of disabling orthostatic hypotension  Atrial fibrillation: Heart rate between 55 and 65 on telemetry. Otherwise, EKG does not reveal any acute ischemic changes, and the noted Q waves are old. She has no other history of cardiac problems, like CHF or CAD. Her CHA2DS2-Vasc Score is 3, which predicts a 3% annual risk of ischemic stroke. Based on this, patient would ideally require to be on anticoagulation therapy. However, given her falls and general clinical status, the risk and benefit of anticoagulation will need to further be discussed with her daughter. She currently takes aspirin. Initial troponin was negative. Plan. -Telemetry. - Continue with aspirin - Consider cardiology consultation - Repeat EKG in the morning  Acute renal injury: Admission, a creatinine of 1.5, up from 0.93 on 09/05/2013. BUN is also slightly increased from 12 to 42. Urinalysis was normal. Most likely acute renal injury is due to prerenal and poor by mouth intake. Plan. -Gentle IV rehydration -Monitor with BMP tomorrow -Likely renal function will recover with IV rehydration  Dementia: Patient is almost completely dependent on her daughter for activities of daily living except feeding.  Dysphagia: Patient apparently, has a history of dysphagia ?etiology. She takes metoclopramide, which helps with this. We'll consult speech therapy for recommendation since complications of side effects from metoclopramide could  preclude its use. NPO until after SLP  VTE PPx: Lovenox  Code: Full code. Daughter is her healthcare power of attorney  Dispo: Disposition is deferred at this time, awaiting improvement of current medical problems. Anticipated discharge in approximately 2-3 day(s).   The patient does have a current PCP Houston Siren, MD), therefore is not require OPC follow-up after discharge.   The patient does not have transportation limitations that hinder transportation to clinic appointments.  Signed:  Jessee Avers, MD PGY-3 Internal Medicine Teaching Service Pager: [phone] (7pm-7am) 11/27/2013, 1:47 PM

## 2013-11-28 ENCOUNTER — Inpatient Hospital Stay (HOSPITAL_COMMUNITY): Payer: Medicare Other

## 2013-11-28 ENCOUNTER — Encounter (HOSPITAL_COMMUNITY): Payer: Self-pay | Admitting: Physician Assistant

## 2013-11-28 DIAGNOSIS — R5381 Other malaise: Secondary | ICD-10-CM

## 2013-11-28 DIAGNOSIS — R5383 Other fatigue: Secondary | ICD-10-CM

## 2013-11-28 DIAGNOSIS — I4891 Unspecified atrial fibrillation: Secondary | ICD-10-CM

## 2013-11-28 LAB — CBC
HCT: 34.4 % — ABNORMAL LOW (ref 36.0–46.0)
HEMOGLOBIN: 11.8 g/dL — AB (ref 12.0–15.0)
MCH: 31.2 pg (ref 26.0–34.0)
MCHC: 34.3 g/dL (ref 30.0–36.0)
MCV: 91 fL (ref 78.0–100.0)
Platelets: 164 10*3/uL (ref 150–400)
RBC: 3.78 MIL/uL — AB (ref 3.87–5.11)
RDW: 13.5 % (ref 11.5–15.5)
WBC: 4.9 10*3/uL (ref 4.0–10.5)

## 2013-11-28 LAB — COMPREHENSIVE METABOLIC PANEL
ALK PHOS: 44 U/L (ref 39–117)
ALT: 16 U/L (ref 0–35)
AST: 17 U/L (ref 0–37)
Albumin: 2.6 g/dL — ABNORMAL LOW (ref 3.5–5.2)
Anion gap: 10 (ref 5–15)
BUN: 33 mg/dL — ABNORMAL HIGH (ref 6–23)
CALCIUM: 8.4 mg/dL (ref 8.4–10.5)
CO2: 20 mEq/L (ref 19–32)
Chloride: 110 mEq/L (ref 96–112)
Creatinine, Ser: 1.16 mg/dL — ABNORMAL HIGH (ref 0.50–1.10)
GFR calc Af Amer: 49 mL/min — ABNORMAL LOW (ref >90)
GFR calc non Af Amer: 42 mL/min — ABNORMAL LOW (ref >90)
Glucose, Bld: 99 mg/dL (ref 70–99)
POTASSIUM: 4.9 meq/L (ref 3.7–5.3)
Sodium: 140 mEq/L (ref 137–147)
TOTAL PROTEIN: 5.5 g/dL — AB (ref 6.0–8.3)
Total Bilirubin: 0.3 mg/dL (ref 0.3–1.2)

## 2013-11-28 LAB — TSH: TSH: 2.95 u[IU]/mL (ref 0.350–4.500)

## 2013-11-28 MED ORDER — ENSURE COMPLETE PO LIQD
237.0000 mL | Freq: Three times a day (TID) | ORAL | Status: DC
  Administered 2013-11-29 – 2013-11-30 (×3): 237 mL via ORAL

## 2013-11-28 MED ORDER — ENOXAPARIN SODIUM 30 MG/0.3ML ~~LOC~~ SOLN
30.0000 mg | SUBCUTANEOUS | Status: DC
  Administered 2013-11-28 – 2013-11-29 (×2): 30 mg via SUBCUTANEOUS
  Filled 2013-11-28 (×2): qty 0.3

## 2013-11-28 MED ORDER — SODIUM CHLORIDE 0.9 % IV SOLN
INTRAVENOUS | Status: AC
  Administered 2013-11-28: 22:00:00 via INTRAVENOUS

## 2013-11-28 NOTE — Progress Notes (Signed)
INITIAL NUTRITION ASSESSMENT  DOCUMENTATION CODES Per approved criteria  -Underweight   INTERVENTION: Continue Ensure Complete TID RD to continue to monitor  NUTRITION DIAGNOSIS: Increased nutrient needs related to underweight status as evidenced by estimated energy needs.   Goal: Pt to meet >/= 90% of their estimated nutrition needs   Monitor:  PO intake, weight trend, labs  Reason for Assessment: Low BMI  80 y.o. female  Admitting Dx: Orthostatic hypotension  ASSESSMENT: 80 y.o. female with h/o reported a-fib, CKD stage III, dementia, orthostatic hypotension, and recent falls with fracture of the left shoulder 08/2013 s/p reverse shoulder arthroplasty 09/03/2013 who was admitted to [facility] on 11/27/13 with acute left facial droop and dysarthria.  Pt out of room at time of first visit and asleep at second attempt. RD spoke with pt;s daughter at bedside who reports pt is sometimes alert and oriented when awake but, not usually. Per pt's daughter pt lost 10-15 lbs after her shoulder surgery in June due to pain causing poor appetite. Daughter states that after pain resolved pt started eating very well again. She eats 3 meals daily, clearing her plate each meal, and she drinks Boost Plus in between meals. She continues to have a good appetite, eating 75-100% of meals today per daughter.   Labs: elevated BUN, decreased hemoglobin  Height: Ht Readings from Last 1 Encounters:  11/27/13 5\' 3"  (1.6 m)    Weight: Wt Readings from Last 1 Encounters:  11/27/13 100 lb (45.36 kg)    Ideal Body Weight: 115 lbs  % Ideal Body Weight: 87%  Wt Readings from Last 10 Encounters:  11/27/13 100 lb (45.36 kg)  08/30/13 105 lb 9.6 oz (47.9 kg)  08/30/13 105 lb 9.6 oz (47.9 kg)  08/24/13 110 lb (49.896 kg)    Usual Body Weight: 110-115 lbs  % Usual Body Weight: 91%  BMI:  Body mass index is 17.72 kg/(m^2). (Underweight)  Estimated Nutritional Needs: Kcal: 1500-1700 Protein: 60-70  grams Fluid: 1.4-1.6 L/day  Skin: intact  Diet Order: Cardiac  EDUCATION NEEDS: -No education needs identified at this time   Intake/Output Summary (Last 24 hours) at 11/28/13 1422 Last data filed at 11/28/13 0411  Gross per 24 hour  Intake     45 ml  Output    450 ml  Net   -405 ml    Last BM: 9/7   Labs:   Recent Labs Lab 11/27/13 0813 11/27/13 0823 11/27/13 1439 11/28/13 0342  NA 140 139  --  140  K 5.3 4.9  --  4.9  CL 105 111  --  110  CO2 24  --   --  20  BUN 42* 38*  --  33*  CREATININE 1.43* 1.50*  --  1.16*  CALCIUM 9.4  --   --  8.4  MG  --   --  1.8  --   PHOS  --   --  3.1  --   GLUCOSE 94 96  --  99    CBG (last 3)  No results found for this basename: GLUCAP,  in the last 72 hours  Scheduled Meds: . aspirin EC  81 mg Oral Daily  . enoxaparin (LOVENOX) injection  30 mg Subcutaneous Q24H  . feeding supplement (ENSURE COMPLETE)  237 mL Oral BID BM  . megestrol  200 mg Oral Daily  . pantoprazole  40 mg Oral Daily  . simvastatin  10 mg Oral q1800    Continuous Infusions:   Past Medical  History  Diagnosis Date  . Arthritis   . Complication of anesthesia     " daughter states she cry's when she wakes up "  . Orthostatic hypotension 08/2013  . CKD (chronic kidney disease), stage III 08/2013    acute renal /dehydration  . Cancer     skin ca on head   . Dysrhythmia     a. reported a-fib; b. OP monitor x 10 days 08/2013 which revealed a-fib; c. primary cardiologist dr. Donnetta Hutching in [place]; c. admission 11/2013 w/ NSR, multifocal P waves, varying PR interval, rare blocked PACs   . Risk for falls     a. fx left shoulder 08/2013 2/2 fall; b. unable to use walker 2/2 fx'd shoulder  . Dementia     Past Surgical History  Procedure Laterality Date  . Abdominal hysterectomy    . Cholecystectomy    . Bladder surgery    . Reverse shoulder arthroplasty Left 09/03/2013    Procedure: REVERSE SHOULDER ARTHROPLASTY;  Surgeon: Renette Butters, MD;  Location: [place];   Service: Orthopedics;  Laterality: Left;    Pryor Ochoa RD, LDN Inpatient Clinical Dietitian Pager: [phone] After Hours Pager: [phone]

## 2013-11-28 NOTE — Progress Notes (Signed)
Occupational Therapy Evaluation Patient Details Name: Kerri Hurst MRN: [id number] DOB: 1927-01-19 Today's Date: 11/28/2013    History of Present Illness Pt is a 80 yo female admitted 9/8 with episode of difficulty walking, slurred speech, and "drawing" of mouth. Patient with history of falls with a fracture dislocation of the left shoulder 08/2013 status post reverse shoulder arthroplasty by Dr. Percell  09/03/2013.   Clinical Impression   PTA pt was living with her daughter and needed assistance for ADLs and functional mobility. Pt was able to walk while holding her daughter's hand until about 1 week ago. Pt was self-feeding. [name] was assisting with bathing and dressing. Pt currently requires total (A) for ADLs, and daughter reports that this morning she was feeding herself and at lunch, pt appeared unable to do this task. Pt would benefit from SNF placement at this time. Pt will benefit from skilled OT to address self-feeding, grooming, and toilet transfers.     Follow Up Recommendations  SNF;Supervision/Assistance - 24 hour    Equipment Recommendations  Other (comment) (defer to SNF)    Recommendations for Other Services       Precautions / Restrictions Precautions Precautions: Fall Precaution Comments: pt with limited ability to conversate Required Braces or Orthoses: Other Brace/Splint Other Brace/Splint: pt is wearing wrist cock-up splint (prefab) on L wrist Restrictions Weight Bearing Restrictions:  (pt self L UE NWB)      Mobility Bed Mobility Overal bed mobility: Needs Assistance Bed Mobility: Supine to Sit;Sit to Supine     Supine to sit: Total assist Sit to supine: Total assist   General bed mobility comments: pt with no initiation of task. Pt was minimally able to reach for OT with RUE on command but could not assist with task.   Transfers Overall transfer level: Needs assistance Equipment used:  (therapist assisted transfer with gait  belt) Transfers: Stand Pivot Transfers;Sit to/from Stand Sit to Stand: Max assist Stand pivot transfers: Max assist;+2 physical assistance       General transfer comment: Pt with no attempts to assist with transfer and unable to achieve full standing. Pt did not attempt to take steps and OT had to guide pt's hips toward target.         ADL Overall ADL's : Needs assistance/impaired                                       General ADL Comments: Pt currently requires total (A) for ADLs. Daughter reports that this morning, pt was able to feed herself. However when lunch came, pt required daughter's assistance and was minimally able to finger feed self. Pt daughter reports that she is less responsive and increasingly weak. Pt was able to respond "yes" to simple questions (do you need to use the bathroom?). Pt required max (A) for stand pivot transfer to sit on BSC. Pt was able to unrinate on toilet and returned to bed with assist.      Vision                 Additional Comments: Pt unable to report visual changes or participate in assessment.    Perception Perception Perception Tested?: No   Praxis Praxis Praxis tested?: Not tested    Pertinent Vitals/Pain  Pt unable to report. Pt did not moan in pain or indicate with facial gestures.      Hand Dominance Right   Extremity/Trunk  Assessment Upper Extremity Assessment Upper Extremity Assessment: LUE deficits/detail;Generalized weakness LUE Deficits / Details: pt guarding LUE, likely due to pain and sx from shoulder surgery in June 2015 (resulting from a fall and fx). Pt wearing Lt wrist cock up splint. Pt moans in pain when LUE is moved.   LUE: Unable to fully assess due to pain LUE Coordination: decreased fine motor;decreased gross motor   Lower Extremity Assessment Lower Extremity Assessment: Generalized weakness   Cervical / Trunk Assessment Cervical / Trunk Assessment: Kyphotic (head forward)    Communication Communication Communication: Other (comment) (pt soft spoken and decreased responsiveness)   Cognition Arousal/Alertness: Lethargic Behavior During Therapy: Flat affect Overall Cognitive Status: Impaired/Different from baseline Area of Impairment: Attention;Following commands;Awareness;Problem solving   Current Attention Level: Focused Memory: Decreased short-term memory Following Commands: Follows one step commands inconsistently;Follows one step commands with increased time   Awareness: Intellectual Problem Solving: Slow processing;Decreased initiation;Difficulty sequencing;Requires verbal cues;Requires tactile cues General Comments: Pt was able to report need to urinate and transferred to [facility] with max [place] expects to be discharged to:: Skilled nursing facility Living Arrangements: Children                                      Prior Functioning/Environment Level of Independence: Needs assistance  Gait / Transfers Assistance Needed: was walking holding onto daughters hand until 1 weeks ago ADL's / Homemaking Assistance Needed: daughter cooks but pt was able to feeds self. pt uses tub bench to bath. pt able to wash face otherwise daughter bathes her        OT Diagnosis: Generalized weakness;Cognitive deficits;Acute pain   OT Problem List: Decreased strength;Decreased range of motion;Decreased activity tolerance;Impaired balance (sitting and/or standing);Decreased cognition;Decreased safety awareness;Decreased knowledge of use of DME or AE;Impaired UE functional use   OT Treatment/Interventions: Self-care/ADL training;Therapeutic exercise;Energy conservation;DME and/or AE instruction;Therapeutic activities;Patient/family education;Balance training    OT Goals(Current goals can be found in the care plan section) Acute Rehab OT Goals Patient Stated Goal: none stated OT Goal Formulation: With  patient/family Time For Goal Achievement: 12/12/13 Potential to Achieve Goals: Fair ADL Goals Pt Will Perform Eating: with set-up;with supervision;sitting Pt Will Perform Grooming: with set-up;with supervision;sitting Pt Will Transfer to Toilet: with min assist;stand pivot transfer;bedside commode  OT Frequency: Min 1X/week   Barriers to D/C:   [name] cannot provide level of assist required by patient.           End of Session Equipment Utilized During Treatment: Gait belt  Activity Tolerance: Patient limited by lethargy Patient left: in bed;with call bell/phone within reach;with family/visitor present   Time: 2035-5974 OT Time Calculation (min): 23 min Charges:  OT General Charges $OT Visit: 1 Procedure OT Evaluation $Initial OT Evaluation Tier I: 1 Procedure OT Treatments $Self Care/Home Management : 8-22 mins  Villa Herb M 11/28/2013, 6:08 PM

## 2013-11-28 NOTE — Progress Notes (Signed)
Patient unable to stay still for MRI. MD paged. One dose of ativan ordered for tomorrow.

## 2013-11-28 NOTE — Progress Notes (Signed)
Internal Medicine Attending  Date: 11/28/2013  Patient name: Kerri Hurst Medical record number: [id number] Date of birth: 1927-03-09 Age: 80 y.o. Gender: female  I saw and evaluated the patient. I discussed patient and reviewed the resident's note by Dr. Randell Patient, and I agree with the resident's findings and plans as documented in her note.

## 2013-11-28 NOTE — Evaluation (Signed)
Physical Therapy Evaluation Patient Details Name: Kerri Hurst MRN: [id number] DOB: 07/12/1926 Today's Date: 11/28/2013   History of Present Illness  Pt is a 80 yo female admitted 9/8 with episode of difficulty walking, slurred speech, and "drawing" of mouth. Patient with history of falls with a fracture dislocation of the left shoulder 08/2013 status post reverse shoulder arthroplasty by Dr. Percell Miller 09/03/2013.  Clinical Impression  Pt admitted with above. Pt presenting with generalized deconditioning and increased lethargy. Pt with significant mobility decline over the last week. Pt currently requiring maxA for all ADLs and transfers. [name] reports she is unable to con't caring for mother at this level of assist. Daughter requests that mother be placed at [facility] in [place] if she doesn't improve t/o hospital stay.     Follow Up Recommendations SNF    Equipment Recommendations  None recommended by PT    Recommendations for Other Services       Precautions / Restrictions Precautions Precautions: Fall Precaution Comments: pt with limited ability to conversate Restrictions Weight Bearing Restrictions:  (pt self L UE NWB)      Mobility  Bed Mobility Overal bed mobility: Needs Assistance Bed Mobility: Supine to Sit     Supine to sit: Total assist     General bed mobility comments: pt with no initiation of task  Transfers Overall transfer level: Needs assistance   Transfers: Sit to/from Stand;Stand Pivot Transfers Sit to Stand: Max assist Stand pivot transfers: Max assist;+2 physical assistance       General transfer comment: pt did lean forward, with max tactile cues pt did attempt to take steps during stand pvt however mostly pivoted on ball of L foot. completed std pvt to chair then to [facility] and then back to chair. pt unable to achieve full upright posture in standing  Ambulation/Gait                Stairs            Wheelchair Mobility     Modified Rankin (Stroke Patients Only)       Balance Overall balance assessment: Needs assistance Sitting-balance support: Feet supported;Single extremity supported Sitting balance-Leahy Scale: Poor Sitting balance - Comments: pt required minA to maintain midline upright posture Postural control: Left lateral lean Standing balance support: Single extremity supported Standing balance-Leahy Scale: Zero Standing balance comment: pt with increased fear of falling, pt requires maximal assist to maintain standing                             Pertinent Vitals/Pain      Home Living Family/patient expects to be discharged to:: Skilled nursing facility Living Arrangements: Children               Additional Comments: pt was living with daughter in 1 story home with 5-6 steps to enter. However now that pt requires maximal assist daughter unable to care for mother any longer.    Prior Function Level of Independence: Needs assistance   Gait / Transfers Assistance Needed: was walking holding onto daughters hand until 1 weeks ago  ADL's / Homemaking Assistance Needed: daughter cooks but pt was able to feeds self. pt uses tub bench to bath. pt able to wash face otherwise daughter bathes her        Hand Dominance   Dominant Hand: Right    Extremity/Trunk Assessment   Upper Extremity Assessment: Defer to OT evaluation;Generalized weakness (L UE held  in elbow flexion, minimal use from previous sx)           Lower Extremity Assessment: Generalized weakness      Cervical / Trunk Assessment: Kyphotic (forward head)  Communication   Communication:  (pt soft spoken and significant delay in response time)  Cognition Arousal/Alertness: Lethargic Behavior During Therapy: Flat affect Overall Cognitive Status: Impaired/Different from baseline Area of Impairment: Attention;Following commands;Awareness;Problem solving   Current Attention Level: Focused Memory:  Decreased short-term memory Following Commands: Follows one step commands with increased time   Awareness: Intellectual Problem Solving: Slow processing;Decreased initiation;Difficulty sequencing;Requires verbal cues;Requires tactile cues General Comments: pt extremely delayed in processing. pt with urinary incontinence upon standing however reports she doesn't have to use bathroom. pt placed on BSC and con't to urinate.    General Comments General comments (skin integrity, edema, etc.): Spoke extensively with daughter regarding d/c plans. Pt reports unable to take mother home in this physical condition and would like [facility]    Exercises        Assessment/Plan    PT Assessment Patient needs continued PT services  PT Diagnosis Difficulty walking   PT Problem List Decreased strength;Decreased activity tolerance;Decreased balance;Decreased mobility  PT Treatment Interventions DME instruction;Gait training;Stair training;Functional mobility training;Therapeutic activities;Therapeutic exercise;Balance training   PT Goals (Current goals can be found in the Care Plan section) Acute Rehab PT Goals PT Goal Formulation: With patient/family Time For Goal Achievement: 12/12/13 Potential to Achieve Goals: Fair    Frequency Min 2X/week   Barriers to discharge Decreased caregiver support daughter unable to physically provide pt current level of assist    Co-evaluation               End of Session Equipment Utilized During Treatment: Gait belt Activity Tolerance: Patient limited by fatigue;Patient limited by lethargy Patient left: in chair;with call bell/phone within reach;with family/visitor present Nurse Communication: Mobility status         Time: 0923-3007 PT Time Calculation (min): 38 min   Charges:   PT Evaluation $Initial PT Evaluation Tier I: 1 Procedure PT Treatments $Therapeutic Activity: 23-37 mins   PT G CodesKingsley Callander 11/28/2013, 9:13 AM  Kittie Plater, PT, DPT Pager #: [phone] Office #: [phone]

## 2013-11-28 NOTE — Clinical Social Work Psychosocial (Signed)
Clinical Social Work Department BRIEF PSYCHOSOCIAL ASSESSMENT 11/28/2013  Patient:  Kerri Hurst, Kerri Hurst     Account Number:  [id number]     Admit date:  11/27/2013  Clinical Social Worker:  Marciano Sequin  Date/Time:  11/28/2013 10:29 AM  Referred by:  RN  Date Referred:  11/28/2013 Referred for  SNF Placement   Other Referral:   Interview type:  Patient Other interview type:    PSYCHOSOCIAL DATA Living Status:  ALONE Admitted from facility:   Level of care:  [place] Primary support name:  Lockie Pares Primary support relationship to patient:  CHILD, ADULT Degree of support available:   Good    CURRENT CONCERNS  Other Concerns:   Pt daughter expressed concern that her mother had used her 1st 20 days at a previous SNF. [name] reported that she can not lift the pt on her own. [name] reported that she can not afford the co-pays for a second SNF placement.    SOCIAL WORK ASSESSMENT / PLAN CSW met pt  bedside and her daughter via phone. CSW introduce self and purpose of visit. Pt presented with a happy affect and cheerful mood. CSW and family discussed rehabs choices. CSW explained the SNF process to the family. CSW and family processed and dicussed alterntive options for the pt. [name] become upset due to her not having the financial means. CSW informed the family to call [facility] to see if they are willing to work out a plan. CSW provided family with contact information for further questions.   Assessment/plan status:  Information/Referral to [employer] Other assessment/ plan:   Information/referral to community resources:   SNF List    PATIENT'S/FAMILY'S RESPONSE TO PLAN OF CARE: the pt is in an agreed.    Chidinma Clites, MSW, LCSWA [phone]

## 2013-11-28 NOTE — Progress Notes (Signed)
Subjective: Ms. Kerri Hurst reports she feels okay this morning. Per daughter she is more disoriented. Denies any recurrence of droop. Tolerating diet. No nausea, vomiting, chest pain, or shortness of breath. Daughter also thinks her mother is weaker this morning.  Objective: Vital signs in last 24 hours: Filed Vitals:   11/27/13 2247 11/28/13 0035 11/28/13 0443 11/28/13 1009  BP: 113/70 115/82 133/70 98/56  Pulse: 72 100 96 68  Temp: 98.2 F (36.8 C) 98.1 F (36.7 C) 98.8 F (37.1 C) 98.5 F (36.9 C)  TempSrc: Oral Oral Oral Oral  Resp: 18 16 16 18   Height:      Weight:      SpO2: 96% 98% 96% 94%   Weight change:   Intake/Output Summary (Last 24 hours) at 11/28/13 1146 Last data filed at 11/28/13 0411  Gross per 24 hour  Intake     45 ml  Output    450 ml  Net   -405 ml   General: Cachectic, elderly woman in NAD Head: atraumatic, normocephalic,  Eye: pupils equal, round and reactive; sclera anicteric; normal conjunctiva  Neck: supple  Lungs/Chest wall: clear to auscultation bilaterally, normal work of breathing  Heart: Irregular heart rate, no murmurs  Pulses: radial and dorsalis pedis pulses are 2+ and symmetric  Abdomen: Normal fullness, no rebound, guarding, or rigidity; normal bowel sounds; no masses or organomegaly  Skin: Widespread ecchymosis on the lower extremities. A small keratotic lesion on the anterior neck. Nontender  Extremities: no peripheral edema, clubbing, or cyanosis  Neurologic: Patient is unable to participate in the complete neurologic exam. Awake and alert. CN II - XII are grossly intact. Normal extraocular eye movements. Pupils are equal, round, and reactive to light. She moves RUE, BLE spontaneously, poor effort. Sensations intact to light touch. DTR normal bilaterally.  Lab Results: Basic Metabolic Panel:  Recent Labs Lab 11/27/13 0813 11/27/13 0823 11/27/13 1439 11/28/13 0342  NA 140 139  --  140  K 5.3 4.9  --  4.9  CL 105 111  --   110  CO2 24  --   --  20  GLUCOSE 94 96  --  99  BUN 42* 38*  --  33*  CREATININE 1.43* 1.50*  --  1.16*  CALCIUM 9.4  --   --  8.4  MG  --   --  1.8  --   PHOS  --   --  3.1  --    Liver Function Tests:  Recent Labs Lab 11/27/13 0813 11/28/13 0342  AST 19 17  ALT 21 16  ALKPHOS 51 44  BILITOT 0.3 0.3  PROT 6.4 5.5*  ALBUMIN 3.1* 2.6*   CBC:  Recent Labs Lab 11/27/13 0813 11/27/13 0823 11/28/13 0342  WBC 7.2  --  4.9  NEUTROABS 4.6  --   --   HGB 13.1 13.3 11.8*  HCT 38.0 39.0 34.4*  MCV 91.1  --  91.0  PLT 185  --  164   Coagulation:  Recent Labs Lab 11/27/13 0813  LABPROT 14.4  INR 1.12   Urine Drug Screen: Drugs of Abuse     Component Value Date/Time   LABOPIA NONE DETECTED 11/27/2013 1057   COCAINSCRNUR NONE DETECTED 11/27/2013 1057   LABBENZ NONE DETECTED 11/27/2013 1057   AMPHETMU NONE DETECTED 11/27/2013 1057   THCU NONE DETECTED 11/27/2013 1057   LABBARB NONE DETECTED 11/27/2013 1057    Alcohol Level:  Recent Labs Lab 11/27/13 0813  ETH <11  Urinalysis:  Recent Labs Lab 11/27/13 1057  COLORURINE YELLOW  LABSPEC 1.013  PHURINE 6.5  GLUCOSEU NEGATIVE  HGBUR NEGATIVE  BILIRUBINUR NEGATIVE  KETONESUR NEGATIVE  PROTEINUR NEGATIVE  UROBILINOGEN 0.2  NITRITE NEGATIVE  LEUKOCYTESUR NEGATIVE   Studies/Results: Ct Head Wo Contrast  11/27/2013   CLINICAL DATA:  Code stroke.  Right facial droop  EXAM: CT HEAD WITHOUT CONTRAST  TECHNIQUE: Contiguous axial images were obtained from the base of the skull through the vertex without intravenous contrast.  COMPARISON:  CT 08/30/2013  FINDINGS: Mild atrophy. Chronic microvascular ischemic change in the white matter is stable from the prior study.  Negative for acute infarct. Negative for hemorrhage or mass. Calvarium intact.  IMPRESSION: Atrophy and chronic microvascular ischemia.  No acute abnormality.  Critical Value/emergent results were called by telephone at the time of interpretation on 11/27/2013 at 8:39  am to Dr. Roland Rack , who verbally acknowledged these results.   Electronically Signed   By: Franchot Gallo M.D.   On: 11/27/2013 08:40   Medications: I have reviewed the patient's current medications. Scheduled Meds: . aspirin EC  81 mg Oral Daily  . enoxaparin (LOVENOX) injection  30 mg Subcutaneous Q24H  . feeding supplement (ENSURE COMPLETE)  237 mL Oral BID BM  . megestrol  200 mg Oral Daily  . pantoprazole  40 mg Oral Daily  . simvastatin  10 mg Oral q1800   Continuous Infusions:  PRN Meds:.acetaminophen-codeine Assessment/Plan: Principal Problem:   Orthostatic hypotension Active Problems:   Encephalopathy   Acute renal failure   Weakness   Atrial fibrillation   Dysphagia, idiopathic   Protein-energy malnutrition  General Weakness with ?Stroke: Initial presentation was concerning for stroke, however, patient was evaluated and there were no focal deficits. Head CT scan without contrast revealed atrophy and chronic microvascular ischemia, but without acute abnormality. Neurology recommended brain MRI and EEG. Unable to obtain brain MRI as patient is unable to lie still for the study and does not tolerate Xanax or Ativan. EEG abnormal awake and asleep suggestive of no specific encephalopathy but can be seen in many conditions including dementia but cannot rule out epilepsy. PT recommended SNF. No dysphagia on SLP evaluation. -OT eval.  -Hold metoprolol, Zanaflex and metoclopramide -Repeat CT scan tomorrow -Consult EP team 9/10 - multifocal P waves with varying PR interval, rare blocked PACs -Follow up TSH -Follow up echo  Orthostatic hypotension: Blood pressure dropped from 139/69 to 83/47 from lying to standing. Pulse rate changed from 83-55. Improved today with BP lying 122/45 and standing 118/92. HR went from 69 to 177 however. -Will hold metoprolol, Zanaflex, and metoclopramide  -Continue with slow IV infusion at 100cc per hour   Atrial fibrillation: EKG does not  reveal any acute ischemic changes, and the noted Q waves are old. She currently takes aspirin and metoprolol. Initial troponin was negative. Cardiology consulted - no a fib on this hospitalization. Some PAF with 10 day monitor by outpatient cardiologist.  - Ok to continue current dose metoprolol per cardiology. Will continue to hold for now. - Continue with aspirin   Acute renal injury: At admission, creatinine of 1.5, up from 0.93 on 09/05/2013. BUN is also slightly increased from 12 to 42. Urinalysis was normal. Most likely acute renal injury is due to prerenal and poor by mouth intake. Cr down to 1.16 today. -Gentle IV rehydration  -Monitor with BMP tomorrow   Dysphagia: History of dysphagia ?etiology. Barium swallow 09/05/2013 with no stricture but tertiary esophageal contractions  and poor peristalsis. On metoclopramide. No dysphagia per SLP. -Still holding metoclopramide.  VTE PPx: Lovenox  Dispo: Disposition is deferred at this time, awaiting improvement of current medical problems.  Anticipated discharge in approximately 2 day(s).   The patient does have a current PCP (Houston Siren, MD) and does not need an [facility] hospital follow-up appointment after discharge.  The patient does not have transportation limitations that hinder transportation to clinic appointments.  .Services Needed at time of discharge: Y = Yes, Blank = No PT:   OT:   RN: SNF  Equipment:   Other:     LOS: 1 day   Jacques Earthly, MD 11/28/2013, 11:46 AM

## 2013-11-28 NOTE — Consult Note (Signed)
Cardiology Consultation Note  Patient ID: Kerri Hurst, MRN: [id number], DOB/AGE: 78-Oct-1928 78 y.o. Admit date: 11/27/2013   Date of Consult: 11/28/2013 Primary Physician: Houston Siren, MD Primary Cardiologist: Gaynelle Arabian, MD in [facility]  Chief Complaint: Generalized weakness and dysarthria  Reason for Consult: Possible a-fib --> no evidence on telemetry   HPI: 80 y.o. female with h/o reported a-fib, CKD stage III, dementia, orthostatic hypotension, and recent falls with fracture of the left shoulder 08/2013 s/p reverse shoulder arthroplasty 09/03/2013 who was admitted to [facility] on 11/27/13 with acute left facial droop and dysarthria.  Reported hx of a-fib diagnosed 08/2013 via 14 day outpatient monitor that patient wore for 10 days before removing. Reportedly this revealed rhythm of a-fib. She was started on aspirin 81 mg and metoprolol 25 mg bid for tx at that time. No full dose AC initiated 2/2 increased fall risk. Patient is unable to stand by herself 2/2 weakness and is unable to use a walker for support/stability because of her recently fractured/repaired left shoulder (08/2013). She spends most of the day taking naps and is not very active.      Patient was at home on 11/27/13 and was having a difficult time drinking water as well as her mouth appeared to be "crooked." EMS was called 2/2 concern for stroke. Code stroke was called upon arrival to the ED and patient was evaluated emergently by neurology in the ED who felt stroke was less likely based on hx and physical exam. CT head no acute abnormality. With atrophy and chronic microvascular ischemia. Blood sugar upon arrival 94, K+ 5.3, SCr 1.43, Na 140, WBC 7.2, ethanol <11, troponin negative x 1, UDS negative. MRI and EEG pending.            Past Medical History  Diagnosis Date  . Arthritis   . Complication of anesthesia     " daughter states she cry's when she wakes up "  . Orthostatic hypotension 08/2013  . Chronic kidney disease 08/2013    acute  renal /dehydration  . Cancer     skin ca on  head   . Dysrhythmia     hx of atrial fibrilation      Most Recent Cardiac Studies: None.    Surgical History:  Past Surgical History  Procedure Laterality Date  . Abdominal hysterectomy    . Cholecystectomy    . Bladder surgery    . Reverse shoulder arthroplasty Left 09/03/2013    Procedure: REVERSE SHOULDER ARTHROPLASTY;  Surgeon: Renette Butters, MD;  Location: [place];  Service: Orthopedics;  Laterality: Left;     Home Meds: Prior to Admission medications   Medication Sig Start Date End Date Taking? Authorizing Provider  acetaminophen-codeine (TYLENOL #3) 300-30 MG per tablet Take 0.5 tablets by mouth every 4 (four) hours as needed for moderate pain.   Yes Historical Provider, MD  Ascorbic Acid (VITAMIN C PO) Take 1 tablet by mouth 2 (two) times daily.   Yes Historical Provider, MD  aspirin EC 81 MG EC tablet Take 1 tablet (81 mg total) by mouth daily. 09/06/13  Yes Thurnell Lose, MD  dicyclomine (BENTYL) 10 MG capsule Take 10 mg by mouth 4 (four) times daily -  before meals and at bedtime.   Yes Historical Provider, MD  docusate sodium 100 MG CAPS Take 200 mg by mouth 2 (two) times daily as needed for mild constipation. 09/06/13  Yes Thurnell Lose, MD  lactose free nutrition (BOOST) LIQD Take 237 mLs  by mouth 3 (three) times daily between meals.   Yes Historical Provider, MD  megestrol (MEGACE) 40 MG/ML suspension Take 200 mg by mouth daily.   Yes Historical Provider, MD  metoCLOPramide (REGLAN) 5 MG tablet Take 1 tablet (5 mg total) by mouth 3 (three) times daily before meals. 09/06/13  Yes Thurnell Lose, MD  metoprolol tartrate (LOPRESSOR) 25 MG tablet Take 25 mg by mouth 2 (two) times daily.   Yes Historical Provider, MD  mometasone (NASONEX) 50 MCG/ACT nasal spray Place 2 sprays into the nose daily as needed (allergies).   Yes Historical Provider, MD  Multiple Vitamins-Minerals (MULTI ADULT GUMMIES PO) Take 1 capsule by mouth  2 (two) times daily.   Yes Historical Provider, MD  pantoprazole (PROTONIX) 40 MG tablet Take 1 tablet (40 mg total) by mouth daily. 09/06/13  Yes Thurnell Lose, MD  pravastatin (PRAVACHOL) 20 MG tablet Take 20 mg by mouth every morning.   Yes Historical Provider, MD  tiZANidine (ZANAFLEX) 2 MG tablet Take 1-2 mg by mouth at bedtime.   Yes Historical Provider, MD  VITAMIN D, ERGOCALCIFEROL, PO Take 1 tablet by mouth 2 (two) times daily.   Yes Historical Provider, MD    Inpatient Medications:  . aspirin EC  81 mg Oral Daily  . enoxaparin (LOVENOX) injection  30 mg Subcutaneous Q24H  . feeding supplement (ENSURE COMPLETE)  237 mL Oral BID BM  . megestrol  200 mg Oral Daily  . pantoprazole  40 mg Oral Daily  . simvastatin  10 mg Oral q1800      Allergies:  Allergies  Allergen Reactions  . Aricept [Donepezil Hcl] Other (See Comments)    Night mares   . Boniva [Ibandronic Acid] Other (See Comments)    Doesn't remember   . Cymbalta [Duloxetine Hcl] Other (See Comments)    Night mares   . Levaquin [Levofloxacin]     nightmares  . Oxybutynin Chloride     Doesn't remember   . Sertraline Hcl     Nightmares  . Silver Sulfadiazine     Doesn't remember   . Tegaderm Ag Mesh [Silver]   . Tramadol     Kept her awake for 3 Days  . Xanax [Alprazolam]     Nightmares  . Keflex [Cephalexin] Rash    History   Social History  . Marital Status: Widowed    Spouse Name: N/A    Number of Children: N/A  . Years of Education: N/A   Occupational History  . Not on file.   Social History Main Topics  . Smoking status: Never Smoker   . Smokeless tobacco: Never Used  . Alcohol Use: No  . Drug Use: No  . Sexual Activity: Not on file   Other Topics Concern  . Not on file   Social History Narrative  . No narrative on file     History reviewed. No pertinent family history.   Review of Systems: General: negative for chills, fever, night sweats or weight changes.  Cardiovascular:  negative for chest pain, edema, orthopnea, palpitations, paroxysmal nocturnal dyspnea, shortness of breath or dyspnea on exertion Dermatological: negative for rash Respiratory: negative for cough or wheezing Urologic: negative for hematuria Abdominal: negative for nausea, vomiting, diarrhea, bright red blood per rectum, melena, or hematemesis Neurologic: negative for dizziness, seizures, and LOC All other systems reviewed and are otherwise negative except as noted above.  Labs: No results found for this basename: CKTOTAL, CKMB, TROPONINI,  in the last 72  hours Lab Results  Component Value Date   WBC 4.9 11/28/2013   HGB 11.8* 11/28/2013   HCT 34.4* 11/28/2013   MCV 91.0 11/28/2013   PLT 164 11/28/2013    Recent Labs Lab 11/28/13 0342  NA 140  K 4.9  CL 110  CO2 20  BUN 33*  CREATININE 1.16*  CALCIUM 8.4  PROT 5.5*  BILITOT 0.3  ALKPHOS 44  ALT 16  AST 17  GLUCOSE 99   No results found for this basename: CHOL, HDL, LDLCALC, TRIG   No results found for this basename: DDIMER    Radiology/Studies:  Ct Head Wo Contrast  11/27/2013   CLINICAL DATA:  Code stroke.  Right facial droop  EXAM: CT HEAD WITHOUT CONTRAST  TECHNIQUE: Contiguous axial images were obtained from the base of the skull through the vertex without intravenous contrast.  COMPARISON:  CT 08/30/2013  FINDINGS: Mild atrophy. Chronic microvascular ischemic change in the white matter is stable from the prior study.  Negative for acute infarct. Negative for hemorrhage or mass. Calvarium intact.  IMPRESSION: Atrophy and chronic microvascular ischemia.  No acute abnormality.  Critical Value/emergent results were called by telephone at the time of interpretation on 11/27/2013 at 8:39 am to Dr. Roland Rack , who verbally acknowledged these results.   Electronically Signed   By: Franchot Gallo M.D.   On: 11/27/2013 08:40    EKG: NSR, 62, PACs (artifact along baseline), no st/t changes  Physical Exam: Blood pressure 98/56,  pulse 68, temperature 98.5 F (36.9 C), temperature source Oral, resp. rate 18, height 5\' 3"  (1.6 m), weight 100 lb (45.36 kg), SpO2 94.00%. General: Well developed, well nourished, in no acute distress. Head: Normocephalic, atraumatic, sclera non-icteric, no xanthomas, nares are without discharge.  Neck: Negative for carotid bruits. JVD not elevated. Lungs: Clear bilaterally to auscultation without wheezes, rales, or rhonchi. Breathing is unlabored. Heart: RRR with S1 S2. No 1/6 systolic murmur. No rubs or gallops appreciated. Abdomen: Soft, non-tender, non-distended with normoactive bowel sounds. No hepatomegaly. No rebound/guarding. No obvious abdominal masses. Msk:  Strength and tone appear normal for age. Extremities: No clubbing or cyanosis. No edema.  Distal pedal pulses are 2+ and equal bilaterally. Wide spread ecchymosis on LE.  Neuro: Alert. Non-vocal. No facial asymmetry. No focal deficit. Moves right upper extremity spontaneously. Left wrist in brace Psych:  Responds to questions appropriately with a normal affect.    Assessment and Plan: 80 year old F with h/o reported a-fib, CKD stage III, dementia, orthostatic hypotension, and recent falls with fracture of the left shoulder 08/2013 s/p reverse shoulder arthroplasty 09/03/2013 who was admitted to [facility] on 11/27/13 with acute left facial droop and dysarthria.  1. Arrhythmia: -Previous EKGs demonstrate NSR with frequent PACs. Telemetry reveals NSR multifocal P waves with varying PR interval. Rare blocked PACs. No evidence of atrial fibrillation on telemetry this admission.  -Check echo -Have EP team see 9/10 -Check TSH -Can continue current dose of metoprolol  -May continue ASA -Continue tele -No role for AC at this time given rhythm and increased fall risk  2. Generalized weakness/dizziness with possible stroke: -Patient has been slowly deconditioning over the past several weeks. Agree with PT/OT evaluation while inpatient.  -Would  benefit from SNF -MRI and EEG pending  3. Orthostatic hypotension: -Continue with slow IV fluids  4. CKD stage III: -Stable   Signed, Leiam Hopwood PA-C 11/28/2013, 11:12 AM

## 2013-11-28 NOTE — Progress Notes (Signed)
Spoke with Dr. Lavetta Nielsen about getting patient's medical records from cardiologist and he stated to cancel.

## 2013-11-28 NOTE — Consult Note (Signed)
Patient examined telemetry and chart reviewed.  Multifocal PAC;s  Not afib.  No evidence of CVA.  Discussed cardiac w/u in [place] with daughter Seen by Dr Donnetta Hutching with 10 day monitor. Apparently did Show some PAF but only started on ASA and metoprolol which I agree given her weakness and frequent falls.  Exam remarkable for marked FTT and weakness especially LE;s .  Needs inpatient Rehab again. Daughter prefers [facility] in [place].  No need for further inpatient w/u  F/U in [facility] if necessary with Dr Revonda Standard

## 2013-11-28 NOTE — Clinical Social Work Placement (Addendum)
Clinical Social Work Department CLINICAL SOCIAL WORK PLACEMENT NOTE 11/28/2013  Patient:  Kerri Hurst, Kerri Hurst  Account Number:  [id number] Admit date:  11/27/2013  Clinical Social Worker:  Paulette Blanch Ilanna Deihl, LCSWA  Date/time:  11/28/2013 12:00 M  Clinical Social Work is seeking post-discharge placement for this patient at the following level of care:   SKILLED NURSING   (*CSW will update this form in Epic as items are completed)   11/28/2013  Patient/family provided with [facility] Department of Clinical Social Work's list of facilities offering this level of care within the geographic area requested by the patient (or if unable, by the patient's family).  11/28/2013  Patient/family informed of their freedom to choose among providers that offer the needed level of care, that participate in Medicare, Medicaid or managed care program needed by the patient, have an available bed and are willing to accept the patient.  11/28/2013  Patient/family informed of [facility]' ownership interest in [facility], as well as of the fact that they are under no obligation to receive care at this facility.  PASARR submitted to EDS on  PASARR number received on   FL2 transmitted to all facilities in geographic area requested by pt/family on  11/28/2013 FL2 transmitted to all facilities within larger geographic area on 11/28/2013  Patient informed that his/her managed care company has contracts with or will negotiate with  certain facilities, including the following:     Patient/family informed of bed offers received:  11/29/2013 Patient chooses bed at [facility] Physician recommends and patient chooses bed at    Patient to be transferred to  on   Patient to be transferred to facility by  Patient and family notified of transfer on  Name of family member notified:    The following physician request were entered in Epic:   Additional Comments: Pt has a previous PASSAR.

## 2013-11-28 NOTE — Procedures (Signed)
EEG report.  Brief clinical history: 80 year-old woman, with past medical history of dementia, atrial fibrillation,with recurrent episodes of decreased responsiveness after standing in the setting of known orthostasis.    Technique: this is a 17 channel routine scalp EEG performed at the bedside with bipolar and monopolar montages arranged in accordance to the international 10/20 system of electrode placement. One channel was dedicated to EKG recording.  The study was performed during wakefulness, drowsiness, and stage 2 sleep. No activating procedures performed.  Description:In the wakeful state, the best background consisted of a medium amplitude, posterior dominant, poorly  sustained, symmetric and reactive 7 Hz rhythm. Beta activity was noted over the anterior head regions. Drowsiness demonstrated dropout of the alpha rhythm. Stage 2 sleep showed symmetric and synchronous sleep spindles without intermixed epileptiform discharges. No focal or generalized epileptiform discharges noted.  EKG showed sinus rhythm.  Impression: this is an abnormal awake and asleep EEG because of the presence of a low background. The findings are suggestive of a no specific encephalopathy as can be seen in a wide variety of neurological conditions including dementia. Please, be aware that the absence of epileptiform discharges does not exclude the possibility of epilepsy.  Clinical correlation is advised.  Dorian Pod, MD

## 2013-11-28 NOTE — Progress Notes (Signed)
Routine adult EEG completed, results pending. 

## 2013-11-28 NOTE — Progress Notes (Signed)
Per Dr. Kyla Balzarine note, no need for further inpatient work up.   Christell Faith, PA-C 11/28/2013 3:16 PM

## 2013-11-29 ENCOUNTER — Inpatient Hospital Stay (HOSPITAL_COMMUNITY): Payer: Medicare Other

## 2013-11-29 DIAGNOSIS — I517 Cardiomegaly: Secondary | ICD-10-CM

## 2013-11-29 LAB — BASIC METABOLIC PANEL
Anion gap: 12 (ref 5–15)
BUN: 31 mg/dL — ABNORMAL HIGH (ref 6–23)
CO2: 20 mEq/L (ref 19–32)
Calcium: 8.4 mg/dL (ref 8.4–10.5)
Chloride: 109 mEq/L (ref 96–112)
Creatinine, Ser: 1.22 mg/dL — ABNORMAL HIGH (ref 0.50–1.10)
GFR calc Af Amer: 46 mL/min — ABNORMAL LOW (ref >90)
GFR, EST NON AFRICAN AMERICAN: 40 mL/min — AB (ref >90)
GLUCOSE: 105 mg/dL — AB (ref 70–99)
POTASSIUM: 4.7 meq/L (ref 3.7–5.3)
Sodium: 141 mEq/L (ref 137–147)

## 2013-11-29 MED ORDER — DICYCLOMINE HCL 10 MG PO CAPS
10.0000 mg | ORAL_CAPSULE | Freq: Three times a day (TID) | ORAL | Status: DC
  Administered 2013-11-29 – 2013-11-30 (×4): 10 mg via ORAL
  Filled 2013-11-29 (×8): qty 1

## 2013-11-29 MED ORDER — SODIUM CHLORIDE 0.9 % IV SOLN
INTRAVENOUS | Status: AC
  Administered 2013-11-29 (×2): via INTRAVENOUS

## 2013-11-29 MED ORDER — BOOST PLUS PO LIQD: 237.0000 mL | Freq: Three times a day (TID) | ORAL | Status: DC

## 2013-11-29 NOTE — Progress Notes (Signed)
  Echocardiogram 2D Echocardiogram has been performed.  Kerri Hurst FRANCES 11/29/2013, 12:43 PM

## 2013-11-29 NOTE — Progress Notes (Signed)
UR COMPLETED  

## 2013-11-29 NOTE — Progress Notes (Signed)
NEURO HOSPITALIST PROGRESS NOTE   SUBJECTIVE:                                                                                                                        Resting comfortably in bed, in good spirits. Family at the bedside. No new neurological developments. Repeat CT brain today showed no acute intracranial abnormality. EEG consistent with mild encephalopathy, most likely secondary to underlying dementia. No epileptiform discharges noted.   OBJECTIVE:                                                                                                                           Vital signs in last 24 hours: Temp:  [97.6 F (36.4 C)-98.8 F (37.1 C)] 98 F (36.7 C) (09/10 0543) Pulse Rate:  [67-125] 125 (09/10 0543) Resp:  [18-19] 18 (09/10 0543) BP: (94-145)/(46-56) 131/49 mmHg (09/10 0543) SpO2:  [94 %-100 %] 97 % (09/10 0543)  Intake/Output from previous day: 09/09 0701 - 09/10 0700 In: 100 [P.O.:100] Out: 400 [Urine:400] Intake/Output this shift:   Nutritional status: Cardiac  Past Medical History  Diagnosis Date  . Arthritis   . Complication of anesthesia     " daughter states she cry's when she wakes up "  . Orthostatic hypotension 08/2013  . CKD (chronic kidney disease), stage III 08/2013    acute renal /dehydration  . Cancer     skin ca on head   . Dysrhythmia     a. reported a-fib; b. OP monitor x 10 days 08/2013 which revealed a-fib; c. primary cardiologist dr. Donnetta Hutching in [place]; c. admission 11/2013 w/ NSR, multifocal P waves, varying PR interval, rare blocked PACs   . Risk for falls     a. fx left shoulder 08/2013 2/2 fall; b. unable to use walker 2/2 fx'd shoulder  . Dementia     Neurologic Exam:  Mental Status:  Awake, alert, follows simple commands, gives month as August, the correct age. No aphasia or dysarthria noted. Cranial Nerves:  II: Visual Fields are full. Pupils are equal, round, and reactive to light. Discs are  difficult to visualize.  III,IV, VI: Eyes will not fully look to the right.  V: Facial sensation is symmetric to temperature  VII: Facial movement is notable for possible left facial weakness  VIII: hearing is intact to voice  X: Uvula elevates symmetrically  XI: Shoulder shrug is symmetric.  XII: tongue is midline without atrophy or fasciculations.  Motor:  She has markedly increased tone in bilateral lower extremities. She is able to lift them, but is weak bilaterally. Her left arm with contracture vs increased tone.  Sensory:  Sensation is symmetric to light touch and temperature in the arms and legs.  Deep Tendon Reflexes:  2+ and symmetric in the biceps and patellae.  Plantars:  Toes are downgoing bilaterally.  Cerebellar:  Does not comply  Gait:  Not assessed    Lab Results: No results found for this basename: cbc, bmp, coags, chol, tri, ldl, hga1c   Lipid Panel No results found for this basename: CHOL, TRIG, HDL, CHOLHDL, VLDL, LDLCALC,  in the last 72 hours  Studies/Results: Ct Head Wo Contrast  11/29/2013   CLINICAL DATA:  Persistent facial droop  EXAM: CT HEAD WITHOUT CONTRAST  TECHNIQUE: Contiguous axial images were obtained from the base of the skull through the vertex without intravenous contrast.  COMPARISON:  November 27, 2013  FINDINGS: Moderate generalized atrophy is stable. There is no mass, hemorrhage, extra-axial fluid collection, or midline shift. There is patchy small vessel disease in the centra semiovale bilaterally, stable. Known new gray-white compartment lesion is identified. No acute infarct is appreciable. Bony calvarium appears intact. The mastoid air cells are clear.  IMPRESSION: Atrophy with periventricular small vessel disease, stable. No demonstrable mass, hemorrhage, or acute appearing infarct.   Electronically Signed   By: Lowella Grip M.D.   On: 11/29/2013 08:31    MEDICATIONS                                                                                                                         Scheduled: . aspirin EC  81 mg Oral Daily  . enoxaparin (LOVENOX) injection  30 mg Subcutaneous Q24H  . feeding supplement (ENSURE COMPLETE)  237 mL Oral TID BM  . megestrol  200 mg Oral Daily  . pantoprazole  40 mg Oral Daily  . simvastatin  10 mg Oral q1800    ASSESSMENT/PLAN:                                                                                                            80 year old female with recurrent episodes of decreased responsiveness after standing in the setting of known orthostasis. Too claustrophobic for MRI but CT brain x  2 unimpressive and EEG without epileptiform discharges. Cerebral hypoperfusion in the context of marked hypotension is the most likely etiology for patient's paroxysmal events, although less likely, can not entirely exclude partial onset seizures with associated autonomic features. However, this should be further addressed in the outpatient setting with prolonged ambulatory EEG if patient's spells persist.   Dorian Pod, MD [facility] [phone]  11/29/2013, 8:58 AM

## 2013-11-29 NOTE — Progress Notes (Signed)
Internal Medicine Attending  Date: 11/29/2013  Patient name: Kerri Hurst Medical record number: [id number] Date of birth: Nov 07, 1926 Age: 80 y.o. Gender: female  I saw and evaluated the patient, and discussed her care with resident.  I reviewed the resident's note by Dr. Posey Pronto and I agree with the resident's findings and plans as documented in his note, with the following additional comments.  The recorded vital signs this morning showed a significant orthostatic drop in patient's blood pressure with standing; we repeated orthostatics this afternoon while we were present at bedside and patient was not orthostatic.  Agree with plans to give some additional IV fluid with close attention to avoid volume overload.  We talked at length with patient's daughter about her medications.  Her daughter does not recall what problem she had with Florinef in the past, but is agreeable to trying a lower dose if needed to treat her orthostatic hypotension.  She was apparently started on metoclopramide during a hospitalization here in June because of abnormal esophageal motility; given her improved mental status off of metoclopramide, and the potential for side effects, we are planning to leave her off of metoclopramide.  She has apparently been on Bentyl for management of diarrhea with good results at a low dose; we discussed potential side effects with daughter, but given prior benefit without obvious problems plan is to resume low dose of Bentyl.

## 2013-11-29 NOTE — Progress Notes (Addendum)
Pt inbed eating super family members at bedside no acute distress observed this time.

## 2013-11-29 NOTE — Progress Notes (Addendum)
Subjective: CXR unremarkable for edema and consolidation. She was tearful this AM after talking to her granddaughter by phone. Daughter was at bedside who complained about her diarrhea since she hadn't been off Reglan & Bentyl.   Later on rounds, she appeared more interactive with the team.   Objective: Vital signs in last 24 hours: Filed Vitals:   11/29/13 0543 11/29/13 0930 11/29/13 0935 11/29/13 0940  BP: 131/49 124/63 114/53 78/31  Pulse: 125 122 75 80  Temp: 98 F (36.7 C) 97.7 F (36.5 C)    TempSrc: Axillary Axillary    Resp: 18 18 18    Height:      Weight:      SpO2: 97% 96%     Weight change:   Intake/Output Summary (Last 24 hours) at 11/29/13 1223 Last data filed at 11/29/13 0900  Gross per 24 hour  Intake    100 ml  Output    800 ml  Net   -700 ml   General: Cachectic, elderly woman in NAD  Head: atraumatic, normocephalic,  Eye: pupils equal, round and reactive; sclera anicteric; normal conjunctiva  Neck: supple  Lungs/Chest wall: clear to auscultation bilaterally, normal work of breathing  Heart: RRR, no murmurs, rubs, or gallops Abdomen: Normal fullness, no rebound, guarding, or rigidity; normal bowel sounds; no masses or organomegaly  Skin: Widespread ecchymosis on the lower extremities. Extremities: no peripheral edema, clubbing, or cyanosis  Neurologic: Awake, alert, and responds appropriately to questions..Pupils are equal, round, and reactive to light. Tracks movement with eyes. Head tipped to her right side. Smiles when asked enough to show front teeth. She moves extremities spontaneously albeit with poor effort.   Lab Results: Basic Metabolic Panel:  Recent Labs Lab 11/27/13 0823 11/27/13 1439 11/28/13 0342 11/29/13 0605  NA 139  --  140 141  K 4.9  --  4.9 4.7  CL 111  --  110 109  CO2  --   --  20 20  GLUCOSE 96  --  99 105*  BUN 38*  --  33* 31*  CREATININE 1.50*  --  1.16* 1.22*  CALCIUM  --   --  8.4 8.4  MG  --  1.8  --   --    PHOS  --  3.1  --   --    Studies/Results: Ct Head Wo Contrast  11/29/2013   CLINICAL DATA:  Persistent facial droop  EXAM: CT HEAD WITHOUT CONTRAST  TECHNIQUE: Contiguous axial images were obtained from the base of the skull through the vertex without intravenous contrast.  COMPARISON:  November 27, 2013  FINDINGS: Moderate generalized atrophy is stable. There is no mass, hemorrhage, extra-axial fluid collection, or midline shift. There is patchy small vessel disease in the centra semiovale bilaterally, stable. Known new gray-white compartment lesion is identified. No acute infarct is appreciable. Bony calvarium appears intact. The mastoid air cells are clear.  IMPRESSION: Atrophy with periventricular small vessel disease, stable. No demonstrable mass, hemorrhage, or acute appearing infarct.   Electronically Signed   By: Lowella Grip M.D.   On: 11/29/2013 08:31   Medications: I have reviewed the patient's current medications. Scheduled Meds: . aspirin EC  81 mg Oral Daily  . enoxaparin (LOVENOX) injection  30 mg Subcutaneous Q24H  . feeding supplement (ENSURE COMPLETE)  237 mL Oral TID BM  . megestrol  200 mg Oral Daily  . pantoprazole  40 mg Oral Daily  . simvastatin  10 mg Oral q1800   Continuous  Infusions:  PRN Meds:.acetaminophen-codeine Assessment/Plan:  Kerri Hurst is a 80 year old female with dementia, h/o a-fib, dysphagia hospitalized for possible CVA found to have generalized weakness, acute kidney injury, and persistent orthostatic hypotension.   General weakness: Likely related to physical deconditioning related to progression of her dementia. CVA possible given a-fib though less likely given stable findings on repeat head CT. Seizure EEG unremarkable for epileptic activity though cannot rule out conclusively. TSH 2.95, unremarkable for thyroid disease. Echo showed EF 22-63%, grade 1 diastolic dysfunction. -OT recommends SNF placement consistent with PT -Hold metoprolol,  Zanaflex and metoclopramide    Orthostatic hypotension: Possibly related to autonomic dysfunction 2/2 dementia. Blood pressure at supine, sitting, standing this afternoon (110/36->100/35->132/107); HR at supine, sitting, standing (56->50->132). She is technically not orthostatic but will continue watching.  -Consider low-dose Florinef as an option if SBP drops dramatically though she had a reaction to it per daughter's report -Give NS 546mL @ 75cc/hr -Will hold metoprolol, Zanaflex, and metoclopramide   Atrial fibrillation: She has not had a-fib on this hospitalization.  -Hold metoprolol in the setting of her low BP -Continue with aspirin   Dementia: Mental status appears improved today and may be related to holding Reglan as it is dopaminergic in nature and can alter neurochemistry unfavorably. -Continue holding Reglan -Recommend discontinuing Megace at discharge given little evidence that it improves PO intake in dementia  Acute kidney injury: Crt 1.2, mildly increased from 1.16 yesterday. 0.93 back in 09/05/2013. Likely related to poor PO intake.  -Continue IV hydration as noted above  Dysphagia: Unknown etiology but stable.  -Holding metoclopramide -Giving Boost instead of Ensure given her preferences  FEN:  -NS 565mL @ 75cc/hr -Diet: Heart Healthy  DVT prophylaxis: Lovenox  CODE STATUS: FULL CODE -Spoke to PCP today about revisiting goals of care  Dispo: Disposition is deferred at this time, awaiting improvement of current medical problems.  Anticipated discharge in approximately 1-2 day(s).   The patient does have a current PCP (Kerri Siren, MD) and does not need an [facility] hospital follow-up appointment after discharge.  The patient does not have transportation limitations that hinder transportation to clinic appointments.  .Services Needed at time of discharge: Y = Yes, Blank = No PT: SNF  OT: SNF  RN:   Equipment:   Other:     LOS: 2 days   Charlott Rakes,  MD 11/29/2013, 12:23 PM

## 2013-11-29 NOTE — Progress Notes (Signed)
Pt reassessed and orthostatic BP checked .  In the presents of the MD s and recorded no new orders given yet

## 2013-11-30 DIAGNOSIS — I4891 Unspecified atrial fibrillation: Secondary | ICD-10-CM

## 2013-11-30 DIAGNOSIS — I951 Orthostatic hypotension: Secondary | ICD-10-CM

## 2013-11-30 DIAGNOSIS — R5381 Other malaise: Secondary | ICD-10-CM

## 2013-11-30 DIAGNOSIS — R5383 Other fatigue: Secondary | ICD-10-CM

## 2013-11-30 DIAGNOSIS — R1310 Dysphagia, unspecified: Secondary | ICD-10-CM

## 2013-11-30 DIAGNOSIS — N179 Acute kidney failure, unspecified: Secondary | ICD-10-CM

## 2013-11-30 DIAGNOSIS — F0390 Unspecified dementia without behavioral disturbance: Secondary | ICD-10-CM

## 2013-11-30 MED ORDER — METOPROLOL TARTRATE 12.5 MG HALF TABLET
12.5000 mg | ORAL_TABLET | Freq: Two times a day (BID) | ORAL | Status: DC
Start: 2013-11-30 — End: 2013-11-30
  Administered 2013-11-30: 12.5 mg via ORAL
  Filled 2013-11-30: qty 1

## 2013-11-30 MED ORDER — FLUDROCORTISONE ACETATE 0.1 MG PO TABS: 0.0500 mg | ORAL_TABLET | Freq: Every day | ORAL | Status: AC

## 2013-11-30 MED ORDER — METOPROLOL TARTRATE 25 MG PO TABS: 12.5000 mg | ORAL_TABLET | Freq: Two times a day (BID) | ORAL | Status: DC

## 2013-11-30 MED ORDER — FLUDROCORTISONE ACETATE 0.1 MG PO TABS
0.0500 mg | ORAL_TABLET | Freq: Every day | ORAL | Status: DC
  Administered 2013-11-30: 0.05 mg via ORAL
  Filled 2013-11-30: qty 0.5

## 2013-11-30 NOTE — Progress Notes (Signed)
Spoke with CSW to let know her that patient does not have a d/c summary, she gave this Probation officer another number to page the MD, MD paged, awaiting call back.

## 2013-11-30 NOTE — Progress Notes (Signed)
Patient is been discharged to the nursing facility, report called to the receiving nurse Alroy Dust.

## 2013-11-30 NOTE — Progress Notes (Signed)
Writer spoke to charge nurse to let her know that patient does not have a d/c order yet,

## 2013-11-30 NOTE — Progress Notes (Signed)
Physical Therapy Treatment Patient Details Name: Kerri Hurst MRN: [id number] DOB: [birth year] Today's Date: 11/30/2013    History of Present Illness Pt is a 80 yo female admitted 9/8 with episode of difficulty walking, slurred speech, and "drawing" of mouth. Patient with history of falls with a fracture dislocation of the left shoulder 08/2013 status post reverse shoulder arthroplasty by Dr. Percell Miller 09/03/2013.    PT Comments    Pt continues to need two person max assist for all mobility.  She is weak, deconditioned and will benefit from SNF level rehab before d/c home with her daughter's assist.  PT will continue to follow acutely and progress to gait as able.   Follow Up Recommendations  SNF     Equipment Recommendations  None recommended by PT    Recommendations for Other Services   NA     Precautions / Restrictions Precautions Precautions: Fall Precaution Comments: pt with limited ability to conversate Required Braces or Orthoses: Other Brace/Splint Other Brace/Splint: pt is wearing wrist cock-up splint (prefab) on L wrist Restrictions Weight Bearing Restrictions:  (self limiting with L arm)    Mobility  Bed Mobility Overal bed mobility: Needs Assistance Bed Mobility: Supine to Sit     Supine to sit: +2 for physical assistance;Max assist     General bed mobility comments: Two person max assist to get to sitting EOB.  Pt did not initiate any movement when asked and given extra time, but did initiate after therapist initiated movement of bil legs and trunk to get to sitting EOB.   Transfers Overall transfer level: Needs assistance Equipment used: None Transfers: Sit to/from [facility] Sit to Stand: +2 physical assistance;Max assist Stand pivot transfers: +2 physical assistance       General transfer comment: Two person max assist to support trunk over weak legs and anteriorly weight shift her body to get to standing and to take pivotal steps to  both the [facility] and the recliner chair.  Daughter helping with peri care.  Pt is able to take weight on her legs in flexed trunk posture, but needed manual and tactile cues to weight shift and move legs for transfer.   Ambulation/Gait             General Gait Details: unable at this time.           Balance Overall balance assessment: Needs assistance Sitting-balance support: Feet supported;Single extremity supported Sitting balance-Leahy Scale: Poor Sitting balance - Comments: Mod assist to prevent posterior LOB while seated EOB.  Postural control: Posterior lean Standing balance support: Single extremity supported Standing balance-Leahy Scale: Zero Standing balance comment: needs two person max assist to stand.                     Cognition Arousal/Alertness: Awake/alert Behavior During Therapy: Flat affect Overall Cognitive Status: Impaired/Different from baseline Area of Impairment: Attention;Memory;Following commands;Safety/judgement;Awareness;Problem solving   Current Attention Level: Focused Memory: Decreased short-term memory Following Commands: Follows one step commands inconsistently;Follows one step commands with increased time Safety/Judgement: Decreased awareness of safety;Decreased awareness of deficits Awareness: Intellectual Problem Solving: Slow processing;Decreased initiation;Difficulty sequencing;Requires verbal cues;Requires tactile cues General Comments: Pt was able to confirm that she needs to go to the bathroom.  Seems fearful of falling.       PT Goals (current goals can now be found in the care plan section) Acute Rehab PT Goals Patient Stated Goal: daughter wants her to go to rehab and get strong enough to go  back home again.  Progress towards PT goals: Progressing toward goals    Frequency  Min 2X/week    PT Plan Current plan remains appropriate       End of Session Equipment Utilized During Treatment: Gait belt Activity  Tolerance: Patient tolerated treatment well Patient left: in chair;with call bell/phone within reach;with family/visitor present     Time: 1208-1225 PT Time Calculation (min): 17 min  Charges:  $Therapeutic Activity: 8-22 mins                     Lumi Winslett B. [place], [place], DPT [phone]   11/30/2013, 12:54 PM

## 2013-11-30 NOTE — Discharge Instructions (Signed)

## 2013-11-30 NOTE — Progress Notes (Signed)
Called the MD to let her know the patient needs a D/C order.

## 2013-11-30 NOTE — Discharge Summary (Signed)
Name: Kerri Hurst MRN: [id number] DOB: 05/20/1926 80 y.o. PCP: Houston Siren, MD  Date of Admission: 11/27/2013  8:15 AM Date of Discharge: 11/30/2013 Attending Physician: Axel Filler, MD  Discharge Diagnosis: Principal Problem:   Orthostatic hypotension Active Problems:   Encephalopathy   Acute renal failure   Weakness   Atrial fibrillation   Dysphagia, idiopathic   Protein-energy malnutrition  Allergies  Allergen Reactions  . Aricept [Donepezil Hcl] Other (See Comments)    Night mares   . Boniva [Ibandronic Acid] Other (See Comments)    Doesn't remember   . Cymbalta [Duloxetine Hcl] Other (See Comments)    Night mares   . Levaquin [Levofloxacin]     nightmares  . Oxybutynin Chloride     Doesn't remember   . Sertraline Hcl     Nightmares  . Silver Sulfadiazine     Doesn't remember   . Tegaderm Ag Mesh [Silver]   . Tramadol     Kept her awake for 3 Days  . Xanax [Alprazolam]     Nightmares  . Keflex [Cephalexin] Rash   Discharge Medications:   Medication List    STOP taking these medications       metoCLOPramide 5 MG tablet  Commonly known as:  REGLAN     metoprolol tartrate 25 MG tablet  Commonly known as:  LOPRESSOR     tiZANidine 2 MG tablet  Commonly known as:  ZANAFLEX      TAKE these medications       acetaminophen-codeine 300-30 MG per tablet  Commonly known as:  TYLENOL #3  Take 0.5 tablets by mouth every 4 (four) hours as needed for moderate pain.     aspirin 81 MG EC tablet  Take 1 tablet (81 mg total) by mouth daily.     dicyclomine 10 MG capsule  Commonly known as:  BENTYL  Take 10 mg by mouth 4 (four) times daily -  before meals and at bedtime.     DSS 100 MG Caps  Take 200 mg by mouth 2 (two) times daily as needed for mild constipation.     fludrocortisone 0.1 MG tablet  Commonly known as:  FLORINEF  Take 0.5 tablets (0.05 mg total) by mouth daily.     lactose free nutrition Liqd  Take 237 mLs by mouth 3  (three) times daily between meals.     megestrol 40 MG/ML suspension  Commonly known as:  MEGACE  Take 200 mg by mouth daily.     mometasone 50 MCG/ACT nasal spray  Commonly known as:  NASONEX  Place 2 sprays into the nose daily as needed (allergies).     MULTI ADULT GUMMIES PO  Take 1 capsule by mouth 2 (two) times daily.     pantoprazole 40 MG tablet  Commonly known as:  PROTONIX  Take 1 tablet (40 mg total) by mouth daily.     pravastatin 20 MG tablet  Commonly known as:  PRAVACHOL  Take 20 mg by mouth every morning.     VITAMIN C PO  Take 1 tablet by mouth 2 (two) times daily.     VITAMIN D (ERGOCALCIFEROL) PO  Take 1 tablet by mouth 2 (two) times daily.        Disposition and follow-up:   Kerri Hurst was discharged from [facility] in Stable condition.  At the hospital follow up visit please address:  1.  Orthostatic hypotension. Restarted Florinef at 0.05 mg daily. If staring spells  persist/worsen, consider prolonged ambulatory EEG. Please follow for any adverse effects of Florinef: patient had intolerance of higher dose, unknown what intolerance was as daughter did not recall. Metoprolol was held and can be considered for restart upon follow up if rate control is needed. Would recommend restarting at lower dose.  2.  Labs / imaging needed at time of follow-up: none  3.  Pending labs/ test needing follow-up: none  Follow-up Appointments: Follow-up Information   Follow up with [facility], MD On 12/03/2013. (1:45 PM)    Specialty:  Family Medicine   Contact information:   [address] [place] [address] [phone]       Call Harmon. (As needed)    Contact information:   [address] [place] [address] [phone]      Call Kathlen Brunswick, MD. (As needed)    Specialty:  Cardiology   Contact information:   [address] [place] [address] [phone]        Discharge Instructions: Discharge Instructions   Call MD for:  difficulty breathing, headache or visual disturbances    Complete by:  As directed      Call MD for:  persistant dizziness or light-headedness    Complete by:  As directed      Call MD for:  persistant nausea and vomiting    Complete by:  As directed      Call MD for:  severe uncontrolled pain    Complete by:  As directed      Call MD for:  temperature >100.4    Complete by:  As directed      Diet - low sodium heart healthy    Complete by:  As directed      Increase activity slowly    Complete by:  As directed            Consultations: Neurology, Cardiology  Procedures Performed:  Ct Head Wo Contrast  11/29/2013   CLINICAL DATA:  Persistent facial droop  EXAM: CT HEAD WITHOUT CONTRAST  TECHNIQUE: Contiguous axial images were obtained from the base of the skull through the vertex without intravenous contrast.  COMPARISON:  November 27, 2013  FINDINGS: Moderate generalized atrophy is stable. There is no mass, hemorrhage, extra-axial fluid collection, or midline shift. There is patchy small vessel disease in the centra semiovale bilaterally, stable. Known new gray-white compartment lesion is identified. No acute infarct is appreciable. Bony calvarium appears intact. The mastoid air cells are clear.  IMPRESSION: Atrophy with periventricular small vessel disease, stable. No demonstrable mass, hemorrhage, or acute appearing infarct.   Electronically Signed   By: Lowella Grip M.D.   On: 11/29/2013 08:31   Ct Head Wo Contrast  11/27/2013   CLINICAL DATA:  Code stroke.  Right facial droop  EXAM: CT HEAD WITHOUT CONTRAST  TECHNIQUE: Contiguous axial images were obtained from the base of the skull through the vertex without intravenous contrast.  COMPARISON:  CT 08/30/2013  FINDINGS: Mild atrophy. Chronic microvascular ischemic change in the white matter is stable from the prior study.  Negative for acute infarct. Negative for  hemorrhage or mass. Calvarium intact.  IMPRESSION: Atrophy and chronic microvascular ischemia.  No acute abnormality.  Critical Value/emergent results were called by telephone at the time of interpretation on 11/27/2013 at 8:39 am to Dr. Roland Rack , who verbally acknowledged these results.   Electronically Signed   By: Franchot Gallo M.D.   On:  11/27/2013 08:40    2D Echo: LV EF: 60% - 65% ------------------------------------------------------------------- Study Conclusions  - Left ventricle: The cavity size was normal. Wall thickness was increased in a pattern of mild LVH. Systolic function was normal. The estimated ejection fraction was in the range of 60% to 65%. Wall motion was normal; there were no regional wall motion abnormalities. Doppler parameters are consistent with abnormal left ventricular relaxation (grade 1 diastolic dysfunction).  Impressions:  - Normal LV function; grade 1 diastolic dysfunction. 11/29/2013  Admission HPI: History is mostly provided by the daughter. Patient is an 81 year old woman, with past medical history of dementia, atrial fibrillation, and dysphagia, who is brought by her daughter to the emergency department, with acute left facial droop and dysarthria, which she noticed this morning at 7 AM. The daughter states that while she was trying to give her drinking water, the patient appeared to have a difficult with swallowing, even though the water was actually going down. She reports that her mouth appeared "crooked", which prompted her to call the EMS due to concern of stroke. On arrival to the emergency department, patient was emergently evaluated by neurology (code stroke), who felt that acute stroke was less likely, based on further history and physical exam. However, patient's daughter states that over the past several weeks, patient has been weak with inability to walk without support. Her legs have been shaky, and she has been unable even to walk for  a few across the room. She feels fatigues most of the day and spends a lot of time taking naps. Patient is dependent on the daughter for all ADLs, except feeding. Family denies history of fevers, chills, nausea, vomiting, or any other constitutional symptoms. The patient has herself does not endorse current pain.  Hospital Course by problem list: Principal Problem:   Orthostatic hypotension Active Problems:   Encephalopathy   Acute renal failure   Weakness   Atrial fibrillation   Dysphagia, idiopathic   Protein-energy malnutrition   1. General Weakness: Multifactorial, including medication induced, orthostatic hypotension, and physical deconditioning. Patient is on metoclopramide, Zanaflex, and metoprolol, which all can cause the stated symptoms. Initial presentation was concerning for stroke, however, patient was evaluated and there were no focal deficits. Neurology did not feel that the patient had any stroke. Initial report of left facial droop was not confirmed in the ED. Head CT scan without contrast revealed atrophy and chronic microvascular ischemia, but without acute abnormality. Neurology recommended brain MRI and EEG. Unable to obtain brain MRI as patient is unable to lie still for the study and does not tolerate Xanax or Ativan. Repeat CT scan stable without acute changes. EEG abnormal awake and asleep suggestive of no specific encephalopathy but can be seen in many conditions including dementia but cannot rule out epilepsy. PT recommended SNF. No dysphagia on SLP evaluation. TSH normal at 2.950. Per neurology, if patient continues to have episodes of decreased responsiveness, this can be addressed in the outpatient setting with prolonged ambulatory EEG.  2. Orthostatic hypotension: Blood pressure dropped from 139/69 to 83/47 from lying to standing. Pulse rate changed from 83-55. According to her daughter, patient has been on metoprolol for at least a month and she was started on 50 mg  twice a day before it was cut down to 25 mg twice a day. She follows up with a cardiologist in [facility]. Held metoprolol, Zanaflex, and metoclopramide. She received IVF. Repeated orthostatics afternoon of 9/10 showed patient was not orthostatic. Her daughter does  not recall what problem her mother had with Florinef in the past, but is agreeable to trying a lower dose if needed to treat her orthostatic hypotension. This morning her BP was 122/56 lying and 83/54 standing. HR ranged from 59 to 50. Started Florinef 0.05mg  daily. Will discharge without metoprolol, metoclopramide or Zanaflex.  3. Paroxysmal atrial fibrillation: Heart rate between 55 and 65 on telemetry. Otherwise, EKG does not reveal any acute ischemic changes, and the noted Q waves are old. She has no other history of cardiac problems, like CHF or CAD. Her CHA2DS2-Vasc Score is 3, which predicts a 3% annual risk of ischemic stroke. Based on this, patient would ideally require to be on anticoagulation therapy. However, given her falls and general clinical status, the risk and benefit of anticoagulation will need to further be discussed with her daughter. She currently takes aspirin. Troponins negative. No atrial fibrillation on this hospitalization. Metoprolol was held and can be considered for restart upon follow up with PCP if rate control is needed. Would recommend restarting at lower dose.  4. Acute renal injury: Admission, a creatinine of 1.5, up from 0.93 on 09/05/2013. BUN is also slightly increased from 12 to 42. Urinalysis was normal. Most likely acute renal injury is due to prerenal and poor by mouth intake. Gently rehydrated with IVF. Cr Improved to 1.22.  5. Dementia: Patient is almost completely dependent on her daughter for activities of daily living except feeding.   6. Dysphagia: Patient apparently, has a history of dysphagia. Barium swallow 09/05/2013 with no stricture but tertiary esophageal contractions and poor peristalsis.  She takes metoclopramide, which helps with this. No dysphagia per SLP. Resumed regular diet.  Discharge Vitals:   BP 112/56  Pulse 115  Temp(Src) 97.5 F (36.4 C) (Axillary)  Resp 16  Ht 5\' 3"  (1.6 m)  Wt 100 lb (45.36 kg)  BMI 17.72 kg/m2  SpO2 99%  General: Cachectic, elderly woman in NAD  Head: atraumatic, normocephalic,  Eye: pupils equal, round and reactive; sclera anicteric; normal conjunctiva  Neck: supple  Lungs/Chest wall: clear to auscultation bilaterally, normal work of breathing  Heart: Irregular heart rate, no murmurs  Pulses: radial and dorsalis pedis pulses are 2+ and symmetric  Abdomen: Normal fullness, no rebound, guarding, or rigidity; normal bowel sounds; no masses or organomegaly  Skin: Widespread ecchymosis on the lower extremities. A small keratotic lesion on the anterior neck. Nontender  Extremities: no peripheral edema, clubbing, or cyanosis  Neurologic: Patient is unable to participate in the complete neurologic exam. Awake and alert. CN II - XII are grossly intact. Normal extraocular eye movements. Pupils are equal, round, and reactive to light. She moves RUE, BLE spontaneously, poor effort. Sensations intact to light touch. DTR normal bilaterally.  Discharge Labs:  Basic Metabolic Panel:  Recent Labs  Lab  11/27/13 0823  11/27/13 1439  11/28/13 0342  11/29/13 0605   NA  139  --  140  141   K  4.9  --  4.9  4.7   CL  111  --  110  109   CO2  --  --  20  20   GLUCOSE  96  --  99  105*   BUN  38*  --  33*  31*   CREATININE  1.50*  --  1.16*  1.22*   CALCIUM  --  --  8.4  8.4   MG  --  1.8  --  --   PHOS  --  3.1  --  --  Liver Function Tests:  Recent Labs  Lab  11/27/13 0813  11/28/13 0342   AST  19  17   ALT  21  16   ALKPHOS  51  44   BILITOT  0.3  0.3   PROT  6.4  5.5*   ALBUMIN  3.1*  2.6*    CBC:  Recent Labs  Lab  11/27/13 0813  11/27/13 0823  11/28/13 0342   WBC  7.2  --  4.9   NEUTROABS  4.6  --  --   HGB  13.1  13.3   11.8*   HCT  38.0  39.0  34.4*   MCV  91.1  --  91.0   PLT  185  --  164    Coagulation:  Recent Labs  Lab  11/27/13 0813   LABPROT  14.4   INR  1.12    Urinalysis:  Recent Labs  Lab  11/27/13 1057   COLORURINE  YELLOW   LABSPEC  1.013   PHURINE  6.5   GLUCOSEU  NEGATIVE   HGBUR  NEGATIVE   BILIRUBINUR  NEGATIVE   KETONESUR  NEGATIVE   PROTEINUR  NEGATIVE   UROBILINOGEN  0.2   NITRITE  NEGATIVE   LEUKOCYTESUR  NEGATIVE     Signed: Jacques Earthly, MD 11/30/2013, 11:13 AM    Services Ordered on Discharge: None Equipment Ordered on Discharge: None  Discharged to SNF

## 2013-11-30 NOTE — Progress Notes (Signed)
Subjective: Kerri Hurst is awake and interactive this morning. She denies dizziness, lightheadedness, chest pain, shortness of breath, nausea.  Objective: Vital signs in last 24 hours: Filed Vitals:   11/29/13 1800 11/29/13 2053 11/30/13 0129 11/30/13 0554  BP: 133/70 122/49 106/74 112/56  Pulse: 111 76 62 115  Temp: 97.2 F (36.2 C) 97.2 F (36.2 C) 97.5 F (36.4 C) 97.5 F (36.4 C)  TempSrc: Oral Oral Axillary Axillary  Resp: 18 16 16 16   Height:      Weight:      SpO2: 95% 98% 97% 99%   Weight change:   Intake/Output Summary (Last 24 hours) at 11/30/13 1107 Last data filed at 11/29/13 2059  Gross per 24 hour  Intake    570 ml  Output    450 ml  Net    120 ml   General: Cachectic, elderly woman in NAD Head: atraumatic, normocephalic,  Eye: pupils equal, round and reactive; sclera anicteric; normal conjunctiva  Neck: supple  Lungs/Chest wall: clear to auscultation bilaterally, normal work of breathing  Heart: Irregular heart rate, no murmurs  Pulses: radial and dorsalis pedis pulses are 2+ and symmetric  Abdomen: Normal fullness, no rebound, guarding, or rigidity; normal bowel sounds; no masses or organomegaly  Skin: Widespread ecchymosis on the lower extremities. A small keratotic lesion on the anterior neck. Nontender  Extremities: no peripheral edema, clubbing, or cyanosis  Neurologic: Patient is unable to participate in the complete neurologic exam. Awake and alert. CN II - XII are grossly intact. Normal extraocular eye movements. Pupils are equal, round, and reactive to light. She moves RUE, BLE spontaneously, poor effort. Sensations intact to light touch. DTR normal bilaterally.  Lab Results: Basic Metabolic Panel:  Recent Labs Lab 11/27/13 0823 11/27/13 1439 11/28/13 0342 11/29/13 0605  NA 139  --  140 141  K 4.9  --  4.9 4.7  CL 111  --  110 109  CO2  --   --  20 20  GLUCOSE 96  --  99 105*  BUN 38*  --  33* 31*  CREATININE 1.50*  --  1.16* 1.22*   CALCIUM  --   --  8.4 8.4  MG  --  1.8  --   --   PHOS  --  3.1  --   --    Liver Function Tests:  Recent Labs Lab 11/27/13 0813 11/28/13 0342  AST 19 17  ALT 21 16  ALKPHOS 51 44  BILITOT 0.3 0.3  PROT 6.4 5.5*  ALBUMIN 3.1* 2.6*   CBC:  Recent Labs Lab 11/27/13 0813 11/27/13 0823 11/28/13 0342  WBC 7.2  --  4.9  NEUTROABS 4.6  --   --   HGB 13.1 13.3 11.8*  HCT 38.0 39.0 34.4*  MCV 91.1  --  91.0  PLT 185  --  164   Coagulation:  Recent Labs Lab 11/27/13 0813  LABPROT 14.4  INR 1.12   Urine Drug Screen: Drugs of Abuse     Component Value Date/Time   LABOPIA NONE DETECTED 11/27/2013 1057   COCAINSCRNUR NONE DETECTED 11/27/2013 1057   LABBENZ NONE DETECTED 11/27/2013 1057   AMPHETMU NONE DETECTED 11/27/2013 1057   THCU NONE DETECTED 11/27/2013 1057   LABBARB NONE DETECTED 11/27/2013 1057    Alcohol Level:  Recent Labs Lab 11/27/13 0813  ETH <11   Urinalysis:  Recent Labs Lab 11/27/13 [place]  LABSPEC 1.013  PHURINE 6.5  [place]  NEGATIVE  BILIRUBINUR NEGATIVE  KETONESUR NEGATIVE  PROTEINUR NEGATIVE  UROBILINOGEN 0.2  NITRITE NEGATIVE  LEUKOCYTESUR NEGATIVE   Studies/Results: Ct Head Wo Contrast  11/29/2013   CLINICAL DATA:  Persistent facial droop  EXAM: CT HEAD WITHOUT CONTRAST  TECHNIQUE: Contiguous axial images were obtained from the base of the skull through the vertex without intravenous contrast.  COMPARISON:  November 27, 2013  FINDINGS: Moderate generalized atrophy is stable. There is no mass, hemorrhage, extra-axial fluid collection, or midline shift. There is patchy small vessel disease in the centra semiovale bilaterally, stable. Known new gray-white compartment lesion is identified. No acute infarct is appreciable. Bony calvarium appears intact. The mastoid air cells are clear.  IMPRESSION: Atrophy with periventricular small vessel disease, stable. No demonstrable mass, hemorrhage, or acute appearing  infarct.   Electronically Signed   By: Lowella Grip M.D.   On: 11/29/2013 08:31   Medications: I have reviewed the patient's current medications. Scheduled Meds: . aspirin EC  81 mg Oral Daily  . dicyclomine  10 mg Oral TID AC & HS  . enoxaparin (LOVENOX) injection  30 mg Subcutaneous Q24H  . feeding supplement (ENSURE COMPLETE)  237 mL Oral TID BM  . lactose free nutrition  237 mL Oral TID WC  . megestrol  200 mg Oral Daily  . metoprolol tartrate  12.5 mg Oral BID  . pantoprazole  40 mg Oral Daily  . simvastatin  10 mg Oral q1800   Continuous Infusions:  PRN Meds:.acetaminophen-codeine Assessment/Plan: Principal Problem:   Orthostatic hypotension Active Problems:   Encephalopathy   Acute renal failure   Weakness   Atrial fibrillation   Dysphagia, idiopathic   Protein-energy malnutrition  General Weakness: Initial presentation was concerning for stroke, however, patient was evaluated and there were no focal deficits. Head CT scan without contrast revealed atrophy and chronic microvascular ischemia, but without acute abnormality. Neurology recommended brain MRI and EEG. Unable to obtain brain MRI as patient is unable to lie still for the study and does not tolerate Xanax or Ativan. EEG abnormal awake and asleep suggestive of no specific encephalopathy but can be seen in many conditions including dementia but cannot rule out epilepsy. PT/OT recommended SNF. No dysphagia on SLP evaluation. Repeat CT scan negative - less likely stroke. TSH wnl at 2.950. -Hold Zanaflex and metoclopramide -Restart metoprolol at decreased dose 12.5mg  BID -Per neurology: If further episodes of decreased responsiveness after standing, should be further addressed in the outpatient setting with prolonged ambulatory EEG.  Orthostatic hypotension: BP 122/56 and HR 59 lying. Standing BP 83/54 and HR 50. -Hold Zanaflex and metoclopramide  -Florinef 0.05mg  daily  Paroxysmal atrial fibrillation: Cardiology  consulted - no a fib on this hospitalization. Echo with LV EF 22-29%, grade 1 diastolic dysfunction. - Ok to continue current dose metoprolol per cardiology. Restarted 12.5mg  BID - Continue with aspirin   Acute renal injury: Cr stable at 1.22 yesterday  Dysphagia: History of dysphagia ?etiology. Barium swallow 09/05/2013 with no stricture but tertiary esophageal contractions and poor peristalsis. On metoclopramide. No dysphagia per SLP. Tolerating diet here without metoclopramide - will continue to hold.  VTE PPx: Lovenox  Dispo: Likely SNF today.  The patient does have a current PCP (Houston Siren, MD) and does not need an [facility] hospital follow-up appointment after discharge.  The patient does not have transportation limitations that hinder transportation to clinic appointments.  .Services Needed at time of discharge: Y = Yes, Blank = No PT:   OT:  RN: SNF  Equipment:   Other:     LOS: 3 days   Jacques Earthly, MD 11/30/2013, 11:07 AM

## 2013-11-30 NOTE — Progress Notes (Signed)
Internal Medicine Attending  Date: 11/30/2013  Patient name: Kerri Hurst Medical record number: [id number] Date of birth: [birth year] Age: 80 y.o. Gender: female  I saw and evaluated the patient. I discussed patient and reviewed the resident's note by Dr. Randell Patient, and I agree with the resident's findings and plans as documented in her note.

## 2013-12-05 NOTE — ED Provider Notes (Signed)
Medical screening examination/treatment/procedure(s) were performed by non-physician practitioner and as supervising physician I was immediately available for consultation/collaboration.   EKG Interpretation   Date/Time:  Tuesday November 27 2013 08:35:27 EDT Ventricular Rate:  62 PR Interval:  138 QRS Duration: 81 QT Interval:  403 QTC Calculation: 409 R Axis:   10 Text Interpretation:  Sinus rhythm Anteroseptal infarct, old Artifact No  significant change since last tracing Confirmed by Wilson Singer  MD, [place]  [phone]) on 11/27/2013 9:15:21 AM       Virgel Manifold, MD 12/05/13 1157

## 2014-01-13 ENCOUNTER — Emergency Department (HOSPITAL_COMMUNITY): Payer: Medicare Other

## 2014-01-13 ENCOUNTER — Inpatient Hospital Stay (HOSPITAL_COMMUNITY)
Admission: EM | Admit: 2014-01-13 | Discharge: 2014-01-15 | DRG: 291 | Disposition: A | Discharge status: Hospice | Payer: Medicare Other | Attending: Internal Medicine | Admitting: Internal Medicine

## 2014-01-13 ENCOUNTER — Encounter (HOSPITAL_COMMUNITY): Payer: Self-pay | Admitting: Emergency Medicine

## 2014-01-13 DIAGNOSIS — E46 Unspecified protein-calorie malnutrition: Secondary | ICD-10-CM

## 2014-01-13 DIAGNOSIS — Z889 Allergy status to unspecified drugs, medicaments and biological substances status: Secondary | ICD-10-CM

## 2014-01-13 DIAGNOSIS — G9341 Metabolic encephalopathy: Secondary | ICD-10-CM | POA: Diagnosis present

## 2014-01-13 DIAGNOSIS — Z7982 Long term (current) use of aspirin: Secondary | ICD-10-CM

## 2014-01-13 DIAGNOSIS — R6251 Failure to thrive (child): Secondary | ICD-10-CM | POA: Diagnosis present

## 2014-01-13 DIAGNOSIS — Z681 Body mass index (BMI) 19 or less, adult: Secondary | ICD-10-CM

## 2014-01-13 DIAGNOSIS — Z79899 Other long term (current) drug therapy: Secondary | ICD-10-CM

## 2014-01-13 DIAGNOSIS — R079 Chest pain, unspecified: Secondary | ICD-10-CM

## 2014-01-13 DIAGNOSIS — M1990 Unspecified osteoarthritis, unspecified site: Secondary | ICD-10-CM | POA: Diagnosis present

## 2014-01-13 DIAGNOSIS — F0390 Unspecified dementia without behavioral disturbance: Secondary | ICD-10-CM | POA: Diagnosis present

## 2014-01-13 DIAGNOSIS — G9340 Encephalopathy, unspecified: Secondary | ICD-10-CM

## 2014-01-13 DIAGNOSIS — Z8673 Personal history of transient ischemic attack (TIA), and cerebral infarction without residual deficits: Secondary | ICD-10-CM

## 2014-01-13 DIAGNOSIS — I4891 Unspecified atrial fibrillation: Secondary | ICD-10-CM | POA: Diagnosis present

## 2014-01-13 DIAGNOSIS — R451 Restlessness and agitation: Secondary | ICD-10-CM | POA: Diagnosis present

## 2014-01-13 DIAGNOSIS — F05 Delirium due to known physiological condition: Secondary | ICD-10-CM

## 2014-01-13 DIAGNOSIS — N183 Chronic kidney disease, stage 3 (moderate): Secondary | ICD-10-CM | POA: Diagnosis present

## 2014-01-13 DIAGNOSIS — T424X5A Adverse effect of benzodiazepines, initial encounter: Secondary | ICD-10-CM | POA: Diagnosis present

## 2014-01-13 DIAGNOSIS — I509 Heart failure, unspecified: Secondary | ICD-10-CM

## 2014-01-13 DIAGNOSIS — Z66 Do not resuscitate: Secondary | ICD-10-CM | POA: Diagnosis present

## 2014-01-13 DIAGNOSIS — Z515 Encounter for palliative care: Secondary | ICD-10-CM

## 2014-01-13 DIAGNOSIS — F419 Anxiety disorder, unspecified: Secondary | ICD-10-CM | POA: Diagnosis present

## 2014-01-13 LAB — COMPREHENSIVE METABOLIC PANEL
ALBUMIN: 2.7 g/dL — AB (ref 3.5–5.2)
ALT: 21 U/L (ref 0–35)
AST: 23 U/L (ref 0–37)
Alkaline Phosphatase: 68 U/L (ref 39–117)
Anion gap: 13 (ref 5–15)
BILIRUBIN TOTAL: 0.5 mg/dL (ref 0.3–1.2)
BUN: 37 mg/dL — AB (ref 6–23)
CHLORIDE: 104 meq/L (ref 96–112)
CO2: 22 meq/L (ref 19–32)
Calcium: 8.9 mg/dL (ref 8.4–10.5)
Creatinine, Ser: 1.23 mg/dL — ABNORMAL HIGH (ref 0.50–1.10)
GFR calc Af Amer: 45 mL/min — ABNORMAL LOW (ref >90)
GFR, EST NON AFRICAN AMERICAN: 39 mL/min — AB (ref >90)
Glucose, Bld: 121 mg/dL — ABNORMAL HIGH (ref 70–99)
Potassium: 4.7 mEq/L (ref 3.7–5.3)
Sodium: 139 mEq/L (ref 137–147)
Total Protein: 7.1 g/dL (ref 6.0–8.3)

## 2014-01-13 LAB — CBC
HCT: 37.8 % (ref 36.0–46.0)
Hemoglobin: 12.6 g/dL (ref 12.0–15.0)
MCH: 31.5 pg (ref 26.0–34.0)
MCHC: 33.3 g/dL (ref 30.0–36.0)
MCV: 94.5 fL (ref 78.0–100.0)
Platelets: 168 10*3/uL (ref 150–400)
RBC: 4 MIL/uL (ref 3.87–5.11)
RDW: 14.6 % (ref 11.5–15.5)
WBC: 10.1 10*3/uL (ref 4.0–10.5)

## 2014-01-13 LAB — PRO B NATRIURETIC PEPTIDE: Pro B Natriuretic peptide (BNP): 854.8 pg/mL — ABNORMAL HIGH (ref 0–450)

## 2014-01-13 LAB — I-STAT TROPONIN, ED: Troponin i, poc: 0.04 ng/mL (ref 0.00–0.08)

## 2014-01-13 MED ORDER — FUROSEMIDE 10 MG/ML IJ SOLN
40.0000 mg | Freq: Once | INTRAMUSCULAR | Status: AC
  Administered 2014-01-13: 40 mg via INTRAVENOUS
  Filled 2014-01-13: qty 4

## 2014-01-13 MED ORDER — FENTANYL CITRATE 0.05 MG/ML IJ SOLN
25.0000 ug | Freq: Once | INTRAMUSCULAR | Status: AC
  Administered 2014-01-13: 25 ug via INTRAVENOUS
  Filled 2014-01-13: qty 2

## 2014-01-13 NOTE — ED Provider Notes (Signed)
CSN: [id number]     Arrival date & time 01/13/14  2035 History   First MD Initiated Contact with Patient 01/13/14 2037     Chief Complaint  Patient presents with  . Chest Pain     (Consider location/radiation/quality/duration/timing/severity/associated sxs/prior Treatment) Patient is a 80 y.o. female presenting with chest pain and shortness of breath. The history is provided by the patient.  Chest Pain Pain quality comment:  Unable to specifyc Timing:  Constant Progression:  Unchanged Chronicity:  New Associated symptoms: shortness of breath   Shortness of Breath Severity:  Moderate Onset quality:  Sudden Timing:  Constant Progression:  Unchanged Chronicity:  New Context: not URI   Relieved by:  Nothing Worsened by:  Nothing tried Associated symptoms: chest pain     Past Medical History  Diagnosis Date  . Arthritis   . Complication of anesthesia     " daughter states she cry's when she wakes up "  . Orthostatic hypotension 08/2013  . CKD (chronic kidney disease), stage III 08/2013    acute renal /dehydration  . Cancer     skin ca on head   . Dysrhythmia     a. reported a-fib; b. OP monitor x 10 days 08/2013 which revealed a-fib; c. primary cardiologist dr. Donnetta Hutching [place]; c. admission 11/2013 w/ NSR, multifocal P waves, varying PR interval, rare blocked PACs   . Risk for falls     a. fx left shoulder 08/2013 2/2 fall; b. unable to use walker 2/2 fx'd shoulder  . Dementia    Past Surgical History  Procedure Laterality Date  . Abdominal hysterectomy    . Cholecystectomy    . Bladder surgery    . Reverse shoulder arthroplasty Left 09/03/2013    Procedure: REVERSE SHOULDER ARTHROPLASTY;  Surgeon: Renette Butters, MD;  Location: [facility];  Service: Orthopedics;  Laterality: Left;   No family history on file. History  Substance Use Topics  . Smoking status: Never Smoker   . Smokeless tobacco: Never Used  . Alcohol Use: No   OB History   Grav Para Term Preterm Abortions  TAB SAB Ect Mult Living                 Review of Systems  Unable to perform ROS: Dementia  Respiratory: Positive for shortness of breath.   Cardiovascular: Positive for chest pain.      Allergies  Aricept; Boniva; Cymbalta; Levaquin; Oxybutynin chloride; Sertraline hcl; Silver sulfadiazine; Tegaderm ag mesh; Tramadol; Xanax; and Keflex  Home Medications   Prior to Admission medications   Medication Sig Start Date End Date Taking? Authorizing Provider  acetaminophen-codeine (TYLENOL #3) 300-30 MG per tablet Take 0.5 tablets by mouth every 4 (four) hours as needed for moderate pain.    Historical Provider, MD  Ascorbic Acid (VITAMIN C PO) Take 1 tablet by mouth 2 (two) times daily.    Historical Provider, MD  aspirin EC 81 MG EC tablet Take 1 tablet (81 mg total) by mouth daily. 09/06/13   Thurnell Lose, MD  dicyclomine (BENTYL) 10 MG capsule Take 10 mg by mouth 4 (four) times daily -  before meals and at bedtime.    Historical Provider, MD  docusate sodium 100 MG CAPS Take 200 mg by mouth 2 (two) times daily as needed for mild constipation. 09/06/13   Thurnell Lose, MD  fludrocortisone (FLORINEF) 0.1 MG tablet Take 0.5 tablets (0.05 mg total) by mouth daily. 11/30/13   Jacques Earthly, MD  lactose free nutrition (BOOST) LIQD Take 237 mLs by mouth 3 (three) times daily between meals.    Historical Provider, MD  megestrol (MEGACE) 40 MG/ML suspension Take 200 mg by mouth daily.    Historical Provider, MD  mometasone (NASONEX) 50 MCG/ACT nasal spray Place 2 sprays into the nose daily as needed (allergies).    Historical Provider, MD  Multiple Vitamins-Minerals (MULTI ADULT GUMMIES PO) Take 1 capsule by mouth 2 (two) times daily.    Historical Provider, MD  pantoprazole (PROTONIX) 40 MG tablet Take 1 tablet (40 mg total) by mouth daily. 09/06/13   Thurnell Lose, MD  pravastatin (PRAVACHOL) 20 MG tablet Take 20 mg by mouth every morning.    Historical Provider, MD  VITAMIN D,  ERGOCALCIFEROL, PO Take 1 tablet by mouth 2 (two) times daily.    Historical Provider, MD   BP 164/90  Temp(Src) 98.3 F (36.8 C) (Oral)  Resp 24  SpO2 95% Physical Exam  Nursing note and vitals reviewed. Constitutional: She appears well-developed and well-nourished. She appears distressed.  HENT:  Head: Normocephalic and atraumatic.  Mouth/Throat: Oropharynx is clear and moist.  Eyes: EOM are normal. Pupils are equal, round, and reactive to light.  Neck: Normal range of motion. Neck supple.  Cardiovascular: Normal rate and regular rhythm.  Exam reveals no friction rub.   No murmur heard. Pulmonary/Chest: Breath sounds normal. She is in respiratory distress (tachypneic). She has no wheezes. She has no rales.  Abdominal: Soft. She exhibits no distension. There is no tenderness. There is no rebound.  Musculoskeletal: Normal range of motion. She exhibits no edema.  Skin: No rash noted. She is not diaphoretic.    ED Course  Procedures (including critical care time) Labs Review Labs Reviewed  CBC  COMPREHENSIVE METABOLIC PANEL  PRO B NATRIURETIC PEPTIDE  I-STAT [place], ED    Imaging Review Dg Chest 2 View  01/13/2014   CLINICAL DATA:  Altered mental status and chest pain. Initial encounter  EXAM: CHEST  2 VIEW  COMPARISON:  08/30/2013  FINDINGS: Borderline cardiomegaly which is accentuated by rotation. Stable aortic and upper mediastinal contours.  No suspected edema or pneumonia. No effusion or visible pneumothorax.  Left glenohumeral arthroplasty. Lower thoracic compression deformity, likely T10, that is chronic. No acute osseous findings.  IMPRESSION: No active cardiopulmonary disease.   Electronically Signed   By: Jorje Guild M.D.   On: 01/13/2014 22:35     EKG Interpretation   Date/Time:  Sunday January 13 2014 20:55:43 EDT Ventricular Rate:  108 PR Interval:  99 QRS Duration: 130 QT Interval:  382 QTC Calculation: 512 R Axis:   23 Text Interpretation:  Sinus  tachycardia Ventricular premature complex  Consider right atrial enlargement Probable left ventricular hypertrophy  Anterior Q waves, possibly due to LVH Prolonged QT interval Probable RV  involvement, suggest recording right precordial leads Artifact in lead(s)  I aVR aVL V1 V2 V3 V4 V5 V6 Severe articfact. Regular rhythm Confirmed by  Mingo Amber  MD, [place] ([id number]) on 01/13/2014 9:23:43 PM      MDM   Final diagnoses:  Chest pain  Acute congestive heart failure, unspecified congestive heart failure type    29F with hx of dementia, Afib who resides in a nursing home presents for CP, SOB. Acute onset today. Patient vocal about needing help to daughter, which is not normal for her. Moaning in pain. Given morphine at her nursing home without relief. Tachypneic here, moaning in pain. Unable to get clear EKG  because she is moaning frequently. Belly benign.  Recent hospital admission for UTI, just finished antibiotic course. No cardiac history. Will check labs, CXR.  Labs show elevated BNP. Plan for admission for new CHF.  Evelina Bucy, MD 01/14/14 [phone]

## 2014-01-13 NOTE — ED Notes (Signed)
Per EMS- pt has a history of chest pain. Reported chest pain tonight. Given 324 Asprin, 5mg  morphine PO at 7pm, and 3 of nitro at nursing home, last one at 7:02. EMS did not get IV. Pt reported chest pain tender to palpation, and with deep breathes. PT had a stoke in may and is normally hard to understand and pleasantly confused. EMS- reported pt warm to touch. Came from [facility]. Just had a dislocation repair to left shoulder.

## 2014-01-14 ENCOUNTER — Encounter (HOSPITAL_COMMUNITY): Payer: Self-pay | Admitting: Internal Medicine

## 2014-01-14 DIAGNOSIS — F419 Anxiety disorder, unspecified: Secondary | ICD-10-CM | POA: Diagnosis present

## 2014-01-14 DIAGNOSIS — E46 Unspecified protein-calorie malnutrition: Secondary | ICD-10-CM

## 2014-01-14 DIAGNOSIS — R6251 Failure to thrive (child): Secondary | ICD-10-CM | POA: Diagnosis present

## 2014-01-14 DIAGNOSIS — R451 Restlessness and agitation: Secondary | ICD-10-CM | POA: Diagnosis present

## 2014-01-14 DIAGNOSIS — F05 Delirium due to known physiological condition: Secondary | ICD-10-CM | POA: Diagnosis not present

## 2014-01-14 DIAGNOSIS — Z681 Body mass index (BMI) 19 or less, adult: Secondary | ICD-10-CM | POA: Diagnosis not present

## 2014-01-14 DIAGNOSIS — Z889 Allergy status to unspecified drugs, medicaments and biological substances status: Secondary | ICD-10-CM | POA: Diagnosis not present

## 2014-01-14 DIAGNOSIS — N183 Chronic kidney disease, stage 3 (moderate): Secondary | ICD-10-CM | POA: Diagnosis present

## 2014-01-14 DIAGNOSIS — I509 Heart failure, unspecified: Secondary | ICD-10-CM | POA: Diagnosis present

## 2014-01-14 DIAGNOSIS — G9341 Metabolic encephalopathy: Secondary | ICD-10-CM | POA: Diagnosis present

## 2014-01-14 DIAGNOSIS — G9340 Encephalopathy, unspecified: Secondary | ICD-10-CM | POA: Diagnosis not present

## 2014-01-14 DIAGNOSIS — Z8673 Personal history of transient ischemic attack (TIA), and cerebral infarction without residual deficits: Secondary | ICD-10-CM | POA: Diagnosis not present

## 2014-01-14 DIAGNOSIS — Z515 Encounter for palliative care: Secondary | ICD-10-CM | POA: Diagnosis not present

## 2014-01-14 DIAGNOSIS — Z7982 Long term (current) use of aspirin: Secondary | ICD-10-CM | POA: Diagnosis not present

## 2014-01-14 DIAGNOSIS — Z789 Other specified health status: Secondary | ICD-10-CM | POA: Diagnosis not present

## 2014-01-14 DIAGNOSIS — Z79899 Other long term (current) drug therapy: Secondary | ICD-10-CM | POA: Diagnosis not present

## 2014-01-14 DIAGNOSIS — F0390 Unspecified dementia without behavioral disturbance: Secondary | ICD-10-CM | POA: Diagnosis present

## 2014-01-14 DIAGNOSIS — Z66 Do not resuscitate: Secondary | ICD-10-CM | POA: Diagnosis present

## 2014-01-14 DIAGNOSIS — R079 Chest pain, unspecified: Secondary | ICD-10-CM

## 2014-01-14 DIAGNOSIS — I4891 Unspecified atrial fibrillation: Secondary | ICD-10-CM | POA: Diagnosis present

## 2014-01-14 DIAGNOSIS — T424X5A Adverse effect of benzodiazepines, initial encounter: Secondary | ICD-10-CM | POA: Diagnosis present

## 2014-01-14 DIAGNOSIS — M1990 Unspecified osteoarthritis, unspecified site: Secondary | ICD-10-CM | POA: Diagnosis present

## 2014-01-14 LAB — URINE MICROSCOPIC-ADD ON

## 2014-01-14 LAB — URINALYSIS, ROUTINE W REFLEX MICROSCOPIC
Bilirubin Urine: NEGATIVE
Glucose, UA: NEGATIVE mg/dL
Ketones, ur: NEGATIVE mg/dL
NITRITE: NEGATIVE
PROTEIN: NEGATIVE mg/dL
Specific Gravity, Urine: 1.012 (ref 1.005–1.030)
Urobilinogen, UA: 0.2 mg/dL (ref 0.0–1.0)
pH: 5 (ref 5.0–8.0)

## 2014-01-14 MED ORDER — CARBIDOPA-LEVODOPA 10-100 MG PO TABS
1.0000 | ORAL_TABLET | Freq: Every day | ORAL | Status: DC
  Filled 2014-01-14 (×2): qty 1

## 2014-01-14 MED ORDER — PRAVASTATIN SODIUM 20 MG PO TABS
20.0000 mg | ORAL_TABLET | Freq: Every morning | ORAL | Status: DC
  Filled 2014-01-14: qty 1

## 2014-01-14 MED ORDER — BETHANECHOL CHLORIDE 5 MG PO TABS
5.0000 mg | ORAL_TABLET | Freq: Three times a day (TID) | ORAL | Status: DC
  Filled 2014-01-14 (×6): qty 1

## 2014-01-14 MED ORDER — MORPHINE SULFATE 10 MG/5ML PO SOLN: 5.0000 mg | ORAL | Status: DC | PRN

## 2014-01-14 MED ORDER — MORPHINE SULFATE 2 MG/ML IJ SOLN
2.0000 mg | INTRAMUSCULAR | Status: DC | PRN
  Administered 2014-01-14 (×4): 2 mg via INTRAVENOUS
  Administered 2014-01-15 (×4): 4 mg via INTRAVENOUS
  Filled 2014-01-14 (×4): qty 2
  Filled 2014-01-14 (×4): qty 1

## 2014-01-14 MED ORDER — ACETAMINOPHEN 650 MG RE SUPP
650.0000 mg | Freq: Four times a day (QID) | RECTAL | Status: DC | PRN
Start: 2014-01-14 — End: 2014-01-15

## 2014-01-14 MED ORDER — ONDANSETRON HCL 4 MG/2ML IJ SOLN: 4.0000 mg | Freq: Four times a day (QID) | INTRAMUSCULAR | Status: DC | PRN

## 2014-01-14 MED ORDER — OXYCODONE HCL 5 MG PO TABS
5.0000 mg | ORAL_TABLET | Freq: Four times a day (QID) | ORAL | Status: DC | PRN
Start: 2014-01-14 — End: 2014-01-15

## 2014-01-14 MED ORDER — LORAZEPAM 2 MG/ML IJ SOLN: 0.5000 mg | Freq: Four times a day (QID) | INTRAMUSCULAR | Status: DC | PRN

## 2014-01-14 MED ORDER — ALBUTEROL SULFATE (2.5 MG/3ML) 0.083% IN NEBU
2.5000 mg | INHALATION_SOLUTION | Freq: Four times a day (QID) | RESPIRATORY_TRACT | Status: DC
  Administered 2014-01-14 – 2014-01-15 (×2): 2.5 mg via RESPIRATORY_TRACT
  Filled 2014-01-14 (×4): qty 3

## 2014-01-14 MED ORDER — POLYETHYLENE GLYCOL 3350 17 G PO PACK
17.0000 g | PACK | Freq: Every day | ORAL | Status: DC
  Filled 2014-01-14 (×2): qty 1

## 2014-01-14 MED ORDER — ASPIRIN EC 81 MG PO TBEC
81.0000 mg | DELAYED_RELEASE_TABLET | Freq: Every day | ORAL | Status: DC
  Filled 2014-01-14 (×2): qty 1

## 2014-01-14 MED ORDER — ACETAMINOPHEN 325 MG PO TABS
650.0000 mg | ORAL_TABLET | Freq: Four times a day (QID) | ORAL | Status: DC | PRN
Start: 2014-01-14 — End: 2014-01-15

## 2014-01-14 MED ORDER — OLANZAPINE 2.5 MG PO TABS
2.5000 mg | ORAL_TABLET | Freq: Every day | ORAL | Status: DC
  Filled 2014-01-14 (×3): qty 1

## 2014-01-14 MED ORDER — PANTOPRAZOLE SODIUM 40 MG PO TBEC: 40.0000 mg | DELAYED_RELEASE_TABLET | Freq: Every day | ORAL | Status: DC

## 2014-01-14 MED ORDER — ONDANSETRON HCL 4 MG PO TABS
4.0000 mg | ORAL_TABLET | Freq: Four times a day (QID) | ORAL | Status: DC | PRN
Start: 2014-01-14 — End: 2014-01-15

## 2014-01-14 MED ORDER — HYDROMORPHONE HCL 1 MG/ML IJ SOLN
1.0000 mg | Freq: Once | INTRAMUSCULAR | Status: AC
  Administered 2014-01-14: 1 mg via INTRAVENOUS
  Filled 2014-01-14: qty 1

## 2014-01-14 MED ORDER — DICYCLOMINE HCL 10 MG PO CAPS
10.0000 mg | ORAL_CAPSULE | Freq: Three times a day (TID) | ORAL | Status: DC
  Filled 2014-01-14 (×9): qty 1

## 2014-01-14 MED ORDER — MORPHINE SULFATE (CONCENTRATE) 10 MG /0.5 ML PO SOLN: 5.0000 mg | ORAL | Status: DC | PRN

## 2014-01-14 MED ORDER — DOCUSATE SODIUM 100 MG PO CAPS: 200.0000 mg | ORAL_CAPSULE | Freq: Two times a day (BID) | ORAL | Status: DC | PRN

## 2014-01-14 MED ORDER — FLUDROCORTISONE ACETATE 0.1 MG PO TABS
0.0500 mg | ORAL_TABLET | Freq: Every day | ORAL | Status: DC
  Filled 2014-01-14 (×2): qty 0.5

## 2014-01-14 NOTE — H&P (Signed)
PCP:  MCFADDEN,JOHN C, MD    Chief Complaint: Chest pain and abdominal pain worsening confusion  HPI: Kerri Hurst is a 80 y.o. female   has a past medical history of Arthritis; Complication of anesthesia; Orthostatic hypotension (08/2013); CKD (chronic kidney disease), stage III (08/2013); Cancer; Dysrhythmia; Risk for falls; and Dementia.   Presented with  Patient was brought by EMS from collapsed nursing facility. She has history of CVA and at baseline pleasantly confused. Patient started to have severe chest abdomen and back pain today. She was given aspirin and morphine by EMS as well as nitroglycerin and brought to emergency Department. Troponin was noted to be with in normal limits with no significant changes on EKG although was of poor quality. Hospitalist was called on admission.  Upon discussion with the family given patient's significant discomfort advanced age and multiple medical problems. Family chose to make patient comfort care only with concentration on pain management. Daughter is at bedside and I discussed this with her at length. Family at this point does not wish any further evaluation. Daughter states that she believes is her mother is in the process of dying.  Of note family has mentioned that she's been treated in the past for urinary tract infection and decubitus ulcer patient has multiple drug allergies currently afebrile. Patient's family states that they would like to treat infections with antibiotics as necessary.  Hospitalist was called for admission for chest pain  Review of Systems:  Unable to obtain due to patient's confusion Past Medical History: Past Medical History  Diagnosis Date  . Arthritis   . Complication of anesthesia     " daughter states she cry's when she wakes up "  . Orthostatic hypotension 08/2013  . CKD (chronic kidney disease), stage III 08/2013    acute renal /dehydration  . Cancer     skin ca on head   . Dysrhythmia     a. reported  a-fib; b. OP monitor x 10 days 08/2013 which revealed a-fib; c. primary cardiologist dr. Donnetta Hutching [place]; c. admission 11/2013 w/ NSR, multifocal P waves, varying PR interval, rare blocked PACs   . Risk for falls     a. fx left shoulder 08/2013 2/2 fall; b. unable to use walker 2/2 fx'd shoulder  . Dementia    Past Surgical History  Procedure Laterality Date  . Abdominal hysterectomy    . Cholecystectomy    . Bladder surgery    . Reverse shoulder arthroplasty Left 09/03/2013    Procedure: REVERSE SHOULDER ARTHROPLASTY;  Surgeon: Renette Butters, MD;  Location: [facility];  Service: Orthopedics;  Laterality: Left;     Medications: Prior to Admission medications   Medication Sig Start Date End Date Taking? Authorizing Provider  acetaminophen (TYLENOL) 650 MG CR tablet Take 650 mg by mouth every 6 (six) hours as needed for pain.   Yes Historical Provider, MD  Amino Acids-Protein Hydrolys (FEEDING SUPPLEMENT, PRO-STAT SUGAR FREE 64,) LIQD Take 30 mLs by mouth 2 (two) times daily.   Yes Historical Provider, MD  aspirin EC 81 MG EC tablet Take 1 tablet (81 mg total) by mouth daily. 09/06/13  Yes Thurnell Lose, MD  bethanechol (URECHOLINE) 5 MG tablet Take 5 mg by mouth 3 (three) times daily.   Yes Historical Provider, MD  carbidopa-levodopa (SINEMET IR) 10-100 MG per tablet Take 1 tablet by mouth daily.   Yes Historical Provider, MD  Cholecalciferol (VITAMIN D3) 2000 UNITS TABS Take 1 tablet by mouth daily.  Yes Historical Provider, MD  dicyclomine (BENTYL) 10 MG capsule Take 10 mg by mouth 4 (four) times daily -  before meals and at bedtime.   Yes Historical Provider, MD  docusate sodium 100 MG CAPS Take 200 mg by mouth 2 (two) times daily as needed for mild constipation. 09/06/13  Yes Thurnell Lose, MD  fludrocortisone (FLORINEF) 0.1 MG tablet Take 0.5 tablets (0.05 mg total) by mouth daily. 11/30/13  Yes Jacques Earthly, MD  fluticasone [facility]) 50 MCG/ACT nasal spray Place 2 sprays into both  nostrils 2 (two) times daily as needed for allergies.   Yes Historical Provider, MD  lidocaine (XYLOCAINE) 5 % ointment Apply 1 application topically 3 (three) times daily. Left trapezious region 12/18/13  Yes Historical Provider, MD  MORPHINE SULFATE PO Take 0.25 mLs by mouth every hour as needed (pain/dyspnea). 0.25 mls (5 mg) sublingual   Yes Historical Provider, MD  Multiple Vitamins-Minerals (MULTI ADULT GUMMIES PO) Take 1 capsule by mouth 2 (two) times daily.   Yes Historical Provider, MD  OLANZapine (ZYPREXA) 2.5 MG tablet Take 2.5 mg by mouth at bedtime.   Yes Historical Provider, MD  oxyCODONE (OXY IR/ROXICODONE) 5 MG immediate release tablet Take 5 mg by mouth every 6 (six) hours as needed for moderate pain or severe pain.   Yes Historical Provider, MD  pantoprazole (PROTONIX) 40 MG tablet Take 1 tablet (40 mg total) by mouth daily. 09/06/13  Yes Thurnell Lose, MD  polyethylene glycol (MIRALAX / GLYCOLAX) packet Take 17 g by mouth daily.   Yes Historical Provider, MD  pravastatin (PRAVACHOL) 20 MG tablet Take 20 mg by mouth every morning.   Yes Historical Provider, MD  senna-docusate (SENNA S) 8.6-50 MG per tablet Take 1 tablet by mouth 2 (two) times daily.   Yes Historical Provider, MD  vitamin C (ASCORBIC ACID) 500 MG tablet Take 500 mg by mouth 2 (two) times daily.   Yes Historical Provider, MD    Allergies:   Allergies  Allergen Reactions  . Aricept [Donepezil Hcl] Other (See Comments)    Night mares   . Boniva [Ibandronic Acid] Other (See Comments)    Doesn't remember   . Cymbalta [Duloxetine Hcl] Other (See Comments)    Night mares   . Levaquin [Levofloxacin]     nightmares  . Oxybutynin Chloride     Doesn't remember   . Sertraline Hcl     Nightmares  . Silver Sulfadiazine     Doesn't remember   . Tegaderm Ag Mesh [Silver]   . Tramadol     Kept her awake for 3 Days  . Xanax [Alprazolam]     Nightmares  . Keflex [Cephalexin] Rash    Social History:  From  facility [facility]     reports that she has never smoked. She has never used smokeless tobacco. She reports that she does not drink alcohol or use illicit drugs.    Family History: family history includes CAD in her mother and sister; Kidney failure in her brother.    Physical Exam: Patient Vitals for the past 24 hrs:  BP Temp Temp src Pulse Resp SpO2 Height Weight  01/13/14 2313 158/90 mmHg - - 107 - 95 % - -  01/13/14 2145 169/92 mmHg - - 86 23 96 % - -  01/13/14 2130 168/107 mmHg - - 81 23 96 % - -  01/13/14 2127 - - - - - - 5\' 3"  (1.6 m) 45.36 kg (100 lb)  01/13/14 2100  162/95 mmHg - - 110 35 94 % - -  01/13/14 2056 164/90 mmHg 98.3 F (36.8 C) Oral - 24 95 % - -    1. General:  Moaning continuously appears to be in significant pain fragile elderly female 2. Psychological: Alert  But not Oriented 3. Head/ENT:   Dry Mucous Membranes                          Head Non traumatic, neck supple                           Poor Dentition 4. SKIN:  decreased Skin turgor,  Skin clean Dry and intact no rash 5. Heart: Regular rate and rhythm no Murmur, Rub or gallop 6. Lungs: no wheezes or crackles   7. Abdomen: Soft, non-tender, Non distended 8. Lower extremities: no clubbing, cyanosis, or edema 9. Neurologicallynot fully cooperative with exam  10. MSK: Normal range of motion  body mass index is 17.72 kg/(m^2).   Labs on Admission:   Results for orders placed during the hospital encounter of 01/13/14 (from the past 24 hour(s))  PRO B NATRIURETIC PEPTIDE     Status: Abnormal   Collection Time    01/13/14  9:13 PM      Result Value Ref Range   Pro B Natriuretic peptide (BNP) 854.8 (*) 0 - 450 pg/mL  CBC     Status: None   Collection Time    01/13/14  9:45 PM      Result Value Ref Range   WBC 10.1  4.0 - 10.5 K/uL   RBC 4.00  3.87 - 5.11 MIL/uL   Hemoglobin 12.6  12.0 - 15.0 g/dL   HCT 37.8  36.0 - 46.0 %   MCV 94.5  78.0 - 100.0 fL   MCH 31.5  26.0 - 34.0 pg   MCHC 33.3   30.0 - 36.0 g/dL   RDW 14.6  11.5 - 15.5 %   Platelets 168  150 - 400 K/uL  COMPREHENSIVE METABOLIC PANEL     Status: Abnormal   Collection Time    01/13/14  9:45 PM      Result Value Ref Range   Sodium 139  137 - 147 mEq/L   Potassium 4.7  3.7 - 5.3 mEq/L   Chloride 104  96 - 112 mEq/L   CO2 22  19 - 32 mEq/L   Glucose, Bld 121 (*) 70 - 99 mg/dL   BUN 37 (*) 6 - 23 mg/dL   Creatinine, Ser 1.23 (*) 0.50 - 1.10 mg/dL   Calcium 8.9  8.4 - 10.5 mg/dL   Total Protein 7.1  6.0 - 8.3 g/dL   Albumin 2.7 (*) 3.5 - 5.2 g/dL   AST 23  0 - 37 U/L   ALT 21  0 - 35 U/L   Alkaline Phosphatase 68  39 - 117 U/L   Total Bilirubin 0.5  0.3 - 1.2 mg/dL   GFR calc non Af Amer 39 (*) >90 mL/min   GFR calc Af Amer 45 (*) >90 mL/min   Anion gap 13  5 - 15  I-STAT [facility], ED     Status: None   Collection Time    01/13/14  9:56 PM      Result Value Ref Range   Troponin i, poc 0.04  0.00 - 0.08 ng/mL   Comment 3  No results found for this basename: HGBA1C    Estimated Creatinine Clearance: 24 ml/min (by C-G formula based on Cr of 1.23).  BNP (last 3 results)  Recent Labs  01/13/14 2113  PROBNP 854.8*    Other results:  I have pearsonaly reviewed this: ECG REPORT  Rate:108  Rhythm: Sinus tachycardia  ST&T Change: Poor baseline does not appear to be ischemic   [facility] Weights   01/13/14 2127  Weight: 45.36 kg (100 lb)     Cultures: No results found for this basename: sdes, specrequest, cult, reptstatus     Radiological Exams on Admission: Dg Chest 2 View  01/13/2014   CLINICAL DATA:  Altered mental status and chest pain. Initial encounter  EXAM: CHEST  2 VIEW  COMPARISON:  08/30/2013  FINDINGS: Borderline cardiomegaly which is accentuated by rotation. Stable aortic and upper mediastinal contours.  No suspected edema or pneumonia. No effusion or visible pneumothorax.  Left glenohumeral arthroplasty. Lower thoracic compression deformity, likely T10, that is chronic. No  acute osseous findings.  IMPRESSION: No active cardiopulmonary disease.   Electronically Signed   By: Jorje Guild M.D.   On: 01/13/2014 22:35    Chart has been reviewed  Assessment/Plan  80 yo F with hx of A.fib, dementia, CKD and recent shoulder fracture here with confusion, chest, abdomen and back pain and comfort care  Present on Admission:  . Encephalopathy - likely metabolic patient is currently comfort care. Will obtain UA and treated for his evidence of UTI  . Atrial fibrillation - for now continue home medications will not advance care at this point  . Chest pain - etiology unclear patient's family choosefor her to be complicated at this point. We'll treat pain as needed   Prophylaxis: SCD , Protonix  CODE STATUS:   DNR/DNI comfort care only   Other plan as per orders.  I have spent a total of 25min on this admission  Najmah Carradine  01/14/2014, 12:22 AM  [facility]  Pager [phone]   after 2 AM please page floor coverage PA If 7AM-7PM, please contact the day team taking care of the patient  [email or web address]  Password [email or web address]

## 2014-01-14 NOTE — ED Notes (Signed)
Pt speaking to her deceased family members. Daughter at bedside.

## 2014-01-14 NOTE — Plan of Care (Signed)
Problem: Phase I Progression Outcomes Goal: Voiding-avoid urinary catheter unless indicated Outcome: Not Met (add Reason) Foley placed for comfort/end of life and sacral wound.

## 2014-01-14 NOTE — Progress Notes (Signed)
Nutrition Brief Note  Chart reviewed. Pt now transitioning to comfort care.  No further nutrition interventions warranted at this time.  Please re-consult as needed.   Anahy Esh La, MS, RD, LDN Pager # [phone] After hours/ weekend pager # [phone]   

## 2014-01-14 NOTE — Clinical Social Work Note (Signed)
Patient is from [facility]. CSW will follow-up with family to determine if patient will return to facility at discharge.  Staphanie Harbison Givens, MSW, LCSW [phone]

## 2014-01-14 NOTE — Progress Notes (Signed)
Pt seen and examined, admitted this am per Dr.Doutova 87/F with h/o Dementia, failure to thrive admitted with generalized pain and worsening confusion Discussed with daughter no plan for further workup, pain control and comfort care is the goal. Pt is obtunded this am, if she doesn't decline/expire quickly daughter is agreeable to [facility]  Domenic Polite, [place]

## 2014-01-15 DIAGNOSIS — I4891 Unspecified atrial fibrillation: Secondary | ICD-10-CM

## 2014-01-15 DIAGNOSIS — Z789 Other specified health status: Secondary | ICD-10-CM

## 2014-01-15 DIAGNOSIS — Z515 Encounter for palliative care: Secondary | ICD-10-CM

## 2014-01-15 DIAGNOSIS — I509 Heart failure, unspecified: Principal | ICD-10-CM

## 2014-01-15 DIAGNOSIS — G9340 Encephalopathy, unspecified: Secondary | ICD-10-CM

## 2014-01-15 DIAGNOSIS — F05 Delirium due to known physiological condition: Secondary | ICD-10-CM

## 2014-01-15 MED ORDER — ALBUTEROL SULFATE (2.5 MG/3ML) 0.083% IN NEBU: 2.5000 mg | INHALATION_SOLUTION | Freq: Four times a day (QID) | RESPIRATORY_TRACT | Status: DC | PRN

## 2014-01-15 MED ORDER — MORPHINE SULFATE (CONCENTRATE) 20 MG/ML PO SOLN: 5.0000 mg | ORAL | Status: AC | PRN

## 2014-01-15 NOTE — Progress Notes (Signed)
Patient Discharge:  Disposition: Discharged to Hospice  Education: Report given to charge nurse at [facility] center.  NV:[email or web address]   Transportation: Transported via ambulance  Belongings: All belongings taken with pt

## 2014-01-15 NOTE — Consult Note (Addendum)
Patient OZ:[id number] Swarm      DOB: April 10, 1926      OIB:[id number]     Consult Note from the Palliative Medicine Team at [facility] Requested by: Dr. Brandt Loosen    PCP: Houston Siren, MD Reason for Consultation: Symptom      Phone Number:[phone] managment Assessment of patients Current state: 80 yr old white female brought from her nursing care facility with chest pain and and abdomen pain.  Patient has been declining over last few months. She has multiple medical issues and her daughter elected to pursue comfort care only with no further testing. Her son in law is at the bedside and states that in between pain medication she appears uncomfortable but in general has responded well to the current medications. Mostly resting.   Goals of Care: 1.  Code Status: DNR Prognosis: days to hours ; definitely 6 months or less. Not eating or drinking.  2. Scope of Treatment: Symptom management only.  Daughter had reported to staff that ativan causes agitation.  4. Disposition: Daugher open to hospice home referral . Requesting [facility]. Information conveyed to SW    3. Symptom Management:   1. Anxiety/Agitation: control pain first.  Patient with adverse reaction to ativan. Could add haldol if it becomes a problem. 2. Pain: Agree with IV morphine and or Roxanol.  Will dc myriad of other medications. For Discharge , write Roxanol 20 mg/ml 5 mg Sl Q2 hrs prn dispense 30 ml.  3. Bowel Regimen: monitor not an issue right now 4. Delirium: due to metabolic encephalopathy and dementia if she is able to take her oral meds continue Zyprexa etc. 5. Fever: Tylenol prn  6. On sinemet for ? Parkinsons vs essential tremor unclear- if able to  7. Terminal Secretions: atropine prn if needed currently not an issue  4. Psychosocial: to be obtained when I speak further with her daughter who has gone home to rest.  5. Spiritual: will be addressed as the family needs  assistance.        Patient Documents Completed or Given: Document Given Completed  Advanced Directives Pkt    MOST    DNR    Gone from My Sight    Hard Choices      Brief HPI: 80 yr old white female admitted with worsening confusion, generalized pain . We were asked to assist with comfort care as family would like this illness to take its natural course while we provide aggressive symptom management.   ROS: unable to provide due to somnolent.    PMH:  Past Medical History  Diagnosis Date  . Arthritis   . Complication of anesthesia     " daughter states she cry's when she wakes up "  . Orthostatic hypotension 08/2013  . CKD (chronic kidney disease), stage III 08/2013    acute renal /dehydration  . Cancer     skin ca on head   . Dysrhythmia     a. reported a-fib; b. OP monitor x 10 days 08/2013 which revealed a-fib; c. primary cardiologist dr. Donnetta Hutching [place]; c. admission 11/2013 w/ NSR, multifocal P waves, varying PR interval, rare blocked PACs   . Risk for falls     a. fx left shoulder 08/2013 2/2 fall; b. unable to use walker 2/2 fx'd shoulder  . Dementia      PSH: Past Surgical History  Procedure Laterality Date  . Abdominal hysterectomy    . Cholecystectomy    .  Bladder surgery    . Reverse shoulder arthroplasty Left 09/03/2013    Procedure: REVERSE SHOULDER ARTHROPLASTY;  Surgeon: Renette Butters, MD;  Location: [facility];  Service: Orthopedics;  Laterality: Left;   I have reviewed the [place] and SH and  If appropriate update it with new information. Allergies  Allergen Reactions  . Aricept [Donepezil Hcl] Other (See Comments)    Night mares   . Boniva [Ibandronic Acid] Other (See Comments)    Doesn't remember   . Cymbalta [Duloxetine Hcl] Other (See Comments)    Night mares   . Levaquin [Levofloxacin]     nightmares  . Lorazepam     Nightmares/hallucinations  . Oxybutynin Chloride     Doesn't remember   . Sertraline Hcl     Nightmares  . Silver Sulfadiazine      Doesn't remember   . Tegaderm Ag Mesh [Silver]   . Tramadol     Kept her awake for 3 Days  . Xanax [Alprazolam]     Nightmares  . Keflex [Cephalexin] Rash   Scheduled Meds: . aspirin EC  81 mg Oral Daily  . bethanechol  5 mg Oral TID  . carbidopa-levodopa  1 tablet Oral Daily  . dicyclomine  10 mg Oral TID AC & HS  . fludrocortisone  0.05 mg Oral Daily  . OLANZapine  2.5 mg Oral QHS  . pantoprazole  40 mg Oral Daily  . polyethylene glycol  17 g Oral Daily   Continuous Infusions:  PRN Meds:.acetaminophen, acetaminophen, albuterol, docusate sodium, LORazepam, morphine injection, morphine CONCENTRATE, ondansetron (ZOFRAN) IV, ondansetron, oxyCODONE    BP 120/65  Pulse 78  Temp(Src) 98 F (36.7 C) (Oral)  Resp 16  Ht 5' 3"  (1.6 m)  Wt 45.36 kg (100 lb)  BMI 17.72 kg/m2  SpO2 92%   PPS: 10%   Intake/Output Summary (Last 24 hours) at 01/15/14 1131 Last data filed at 01/15/14 0900  Gross per 24 hour  Intake      0 ml  Output    450 ml  Net   -450 ml   LBM:                     Physical Exam:  General: Somnolent does not rouse to gentle tactile or verbal stimuli HEENT:  Pupils not examined, mm dry, thinning hair, suken eyes Chest:  Decreased anteriorly no RR or W CVS: Sounds rrr, S1, S2 n MRG noted in current position Abdomen: soft scaphoid not tender Ext:  Warm, no mottling Neuro: somnolent, not rousing to gentle tactile stimuli  Labs: CBC    Component Value Date/Time   WBC 10.1 01/13/2014 2145   RBC 4.00 01/13/2014 2145   HGB 12.6 01/13/2014 2145   HCT 37.8 01/13/2014 2145   PLT 168 01/13/2014 2145   MCV 94.5 01/13/2014 2145   MCH 31.5 01/13/2014 2145   MCHC 33.3 01/13/2014 2145   RDW 14.6 01/13/2014 2145   LYMPHSABS 1.8 11/27/2013 0813   MONOABS 0.5 11/27/2013 0813   EOSABS 0.2 11/27/2013 0813   BASOSABS 0.0 11/27/2013 0813     CMP     Component Value Date/Time   NA 139 01/13/2014 2145   K 4.7 01/13/2014 2145   CL 104 01/13/2014 2145   CO2 22  01/13/2014 2145   GLUCOSE 121* 01/13/2014 2145   BUN 37* 01/13/2014 2145   CREATININE 1.23* 01/13/2014 2145   CALCIUM 8.9 01/13/2014 2145   PROT 7.1 01/13/2014 2145   ALBUMIN  2.7* 01/13/2014 2145   AST 23 01/13/2014 2145   ALT 21 01/13/2014 2145   ALKPHOS 68 01/13/2014 2145   BILITOT 0.5 01/13/2014 2145   GFRNONAA 39* 01/13/2014 2145   GFRAA 45* 01/13/2014 2145    Chest Xray Reviewed/Impressions:No active cardiopulmonary disease    Time In Time Out Total Time Spent with Patient Total Overall Time  1115 am  1145 am 15 min 30 min   Discussed with Social worker and TRH  Greater than 50%  of this time was spent counseling and coordinating care related to the above assessment and plan.  Damieon Armendariz L. Lovena Le, MD MBA The Palliative Medicine Team at [facility] Phone: [phone] Pager: [phone] ( Use team phone after hours)

## 2014-01-15 NOTE — Clinical Social Work Note (Addendum)
CSW received consult for hospice facility placement. CSW talked with patient's son-in-law and was informed that his wife (patient's daughter) went home. He informed CSW that daughter wants [facility]. Call made to Diane, RN with [facility] to make referral, and also attempted to reach daughter and message left. Diane contacted daughter regarding patient's acceptance to their facility and later met with daughter to complete admissions paperwork. CSW facilitated transport to Hospice facility via ambulance. CSW signing off as patient discharged to [facility] facility.  Corey Caulfield Givens, MSW, LCSW [phone]

## 2014-01-15 NOTE — Discharge Summary (Signed)
Kerri Hurst, 80 y.o., DOB Sep 05, 1926, MRN [id number]. Admission date: 01/13/2014 Discharge Date 01/15/2014 Primary MD Houston Siren, MD Admitting Physician Toy Baker, MD  Admission Diagnosis  Protein-energy malnutrition [E46] Chest pain [R07.9] Chest pain, unspecified chest pain type [R07.9] Acute congestive heart failure, unspecified congestive heart failure type [I50.9]  Discharge Diagnosis   Active Problems:   Encephalopathy   Atrial fibrillation   CHF exacerbation   Chest pain      Past Medical History  Diagnosis Date  . Arthritis   . Complication of anesthesia     " daughter states she cry's when she wakes up "  . Orthostatic hypotension 08/2013  . CKD (chronic kidney disease), stage III 08/2013    acute renal /dehydration  . Cancer     skin ca on head   . Dysrhythmia     a. reported a-fib; b. OP monitor x 10 days 08/2013 which revealed a-fib; c. primary cardiologist dr. Donnetta Hutching in [place]; c. admission 11/2013 w/ NSR, multifocal P waves, varying PR interval, rare blocked PACs   . Risk for falls     a. fx left shoulder 08/2013 2/2 fall; b. unable to use walker 2/2 fx'd shoulder  . Dementia     Past Surgical History  Procedure Laterality Date  . Abdominal hysterectomy    . Cholecystectomy    . Bladder surgery    . Reverse shoulder arthroplasty Left 09/03/2013    Procedure: REVERSE SHOULDER ARTHROPLASTY;  Surgeon: Renette Butters, MD;  Location: [facility];  Service: Orthopedics;  Laterality: Left;   Brief narrative: Patient was brought by EMS from collapsed nursing facility. She has history of CVA and at baseline pleasantly confused. Patient started to have severe chest abdomen and back pain  She was given aspirin and morphine by EMS as well as nitroglycerin and brought to emergency Department. Troponin was noted to be with in normal limits with no significant changes on EKG although was of poor quality.  Patient has been declining over last few months. She has multiple  medical issues and her daughter elected to pursue comfort care only with no further testing, patient was seen by palliative care, and family decided on residential hospice.   Hospital Course See H&P, Labs, Consult and Test reports for all details in brief, patient was admitted for  Active Problems:   Encephalopathy   Atrial fibrillation   CHF exacerbation   Chest pain  At this point family does not want to pursue any further workup, and plan is for residential hospice .  Consults   Palliative  Significant Tests:  See full reports for all details    Dg Chest 2 View  01/13/2014   CLINICAL DATA:  Altered mental status and chest pain. Initial encounter  EXAM: CHEST  2 VIEW  COMPARISON:  08/30/2013  FINDINGS: Borderline cardiomegaly which is accentuated by rotation. Stable aortic and upper mediastinal contours.  No suspected edema or pneumonia. No effusion or visible pneumothorax.  Left glenohumeral arthroplasty. Lower thoracic compression deformity, likely T10, that is chronic. No acute osseous findings.  IMPRESSION: No active cardiopulmonary disease.   Electronically Signed   By: Jorje Guild M.D.   On: 01/13/2014 22:35     Today   Subjective:   Kerri Hurst is unresponsive.  Objective:   Blood pressure 120/65, pulse 78, temperature 98 F (36.7 C), temperature source Oral, resp. rate 16, height 5\' 3"  (1.6 m), weight 45.36 kg (100 lb), SpO2 92.00%.  Intake/Output Summary (Last 24 hours) at  01/15/14 1236 Last data filed at 01/15/14 0900  Gross per 24 hour  Intake      0 ml  Output    450 ml  Net   -450 ml    Exam Patient is minimally responsive, with occasional morning. Supple Neck,No JVD, No cervical lymphadenopathy appriciated. Dry oral mucosa Symmetrical Chest wall movement, increased air movement bilaterally, CTAB RRR,No Gallops,Rubs or new Murmurs, No Parasternal Heave +ve B.Sounds, Abd Soft, Non tender, No organomegaly appriciated, No rebound -guarding or  rigidity. No Cyanosis, Clubbing or edema, No new Rash or bruise  Data Review    CBC w Diff: Lab Results  Component Value Date   WBC 10.1 01/13/2014   HGB 12.6 01/13/2014   HCT 37.8 01/13/2014   PLT 168 01/13/2014   LYMPHOPCT 26 11/27/2013   MONOPCT 7 11/27/2013   EOSPCT 3 11/27/2013   BASOPCT 0 11/27/2013   CMP: Lab Results  Component Value Date   NA 139 01/13/2014   K 4.7 01/13/2014   CL 104 01/13/2014   CO2 22 01/13/2014   BUN 37* 01/13/2014   CREATININE 1.23* 01/13/2014   PROT 7.1 01/13/2014   ALBUMIN 2.7* 01/13/2014   BILITOT 0.5 01/13/2014   ALKPHOS 68 01/13/2014   AST 23 01/13/2014   ALT 21 01/13/2014  .  Micro Results Recent Results (from the past 240 hour(s))  URINE CULTURE     Status: None   Collection Time    01/14/14  8:22 AM      Result Value Ref Range Status   Specimen Description URINE, CATHETERIZED   Final   Special Requests NONE   Final   Culture  Setup Time     Final   Value: 01/14/2014 15:36     Performed at [facility]     Final   Value: >=100,000 COLONIES/ML     Performed at [facility]   Culture     Final   Value: ENTEROCOCCUS SPECIES     Performed at [facility]   Report Status PENDING   Incomplete     Discharge Instructions     As per residential hospice recommendation, diet and oral medication as tolerated.   Discharge Medications     Medication List    STOP taking these medications       aspirin 81 MG EC tablet     bethanechol 5 MG tablet  Commonly known as:  URECHOLINE     fluticasone 50 MCG/ACT nasal spray  Commonly known as:  FLONASE     lidocaine 5 % ointment  Commonly known as:  XYLOCAINE     MORPHINE SULFATE PO  Replaced by:  morphine 20 MG/ML concentrated solution     oxyCODONE 5 MG immediate release tablet  Commonly known as:  Oxy IR/ROXICODONE     pantoprazole 40 MG tablet  Commonly known as:  PROTONIX     pravastatin 20 MG tablet  Commonly known as:  PRAVACHOL      vitamin C 500 MG tablet  Commonly known as:  ASCORBIC ACID      TAKE these medications       acetaminophen 650 MG CR tablet  Commonly known as:  TYLENOL  Take 650 mg by mouth every 6 (six) hours as needed for pain.     carbidopa-levodopa 10-100 MG per tablet  Commonly known as:  SINEMET IR  Take 1 tablet by mouth daily.     dicyclomine 10 MG capsule  Commonly known as:  BENTYL  Take 10 mg by mouth 4 (four) times daily -  before meals and at bedtime.     DSS 100 MG Caps  Take 200 mg by mouth 2 (two) times daily as needed for mild constipation.     feeding supplement (PRO-STAT SUGAR FREE 64) Liqd  Take 30 mLs by mouth 2 (two) times daily.     fludrocortisone 0.1 MG tablet  Commonly known as:  FLORINEF  Take 0.5 tablets (0.05 mg total) by mouth daily.     morphine 20 MG/ML concentrated solution  Commonly known as:  ROXANOL  Place 0.25 mLs (5 mg total) under the tongue every 2 (two) hours as needed for moderate pain, severe pain, breakthrough pain, anxiety or shortness of breath.     MULTI ADULT GUMMIES PO  Take 1 capsule by mouth 2 (two) times daily.     OLANZapine 2.5 MG tablet  Commonly known as:  ZYPREXA  Take 2.5 mg by mouth at bedtime.     polyethylene glycol packet  Commonly known as:  MIRALAX / GLYCOLAX  Take 17 g by mouth daily.     SENNA S 8.6-50 MG per tablet  Generic drug:  senna-docusate  Take 1 tablet by mouth 2 (two) times daily.      ASK your doctor about these medications       Vitamin D3 2000 UNITS Tabs  Take 1 tablet by mouth daily.         Total Time in preparing paper work, data evaluation and todays exam - 35 minutes  Onika Gudiel M.D on 01/15/2014 at 12:36 PM  [place]  [phone]

## 2014-01-15 NOTE — Discharge Instructions (Signed)
° °  Patient is for discharge to residential hospice, measurement as there facility physician, meanwhile continue with comfort care measures, codeine when necessary morphine, diet as tolerated, and discharge medication as long she is able to tolerate by mouth.  On your next visit with your primary care physician please Get Medicines reviewed and adjusted.   Please request your Prim.MD to go over all Hospital Tests and Procedure/Radiological results at the follow up, please get all Hospital records sent to your Prim MD by signing hospital release before you go home.   If you experience worsening of your admission symptoms, develop shortness of breath, life threatening emergency, suicidal or homicidal thoughts you must seek medical attention immediately by calling 911 or calling your MD immediately  if symptoms less severe.  You Must read complete instructions/literature along with all the possible adverse reactions/side effects for all the Medicines you take and that have been prescribed to you. Take any new Medicines after you have completely understood and accpet all the possible adverse reactions/side effects.   Do not drive, operating heavy machinery, perform activities at heights, swimming or participation in water activities or provide baby sitting services if your were admitted for syncope or siezures until you have seen by Primary MD or a Neurologist and advised to do so again.  Do not drive when taking Pain medications.    Do not take more than prescribed Pain, Sleep and Anxiety Medications  Special Instructions: If you have smoked or chewed Tobacco  in the last 2 yrs please stop smoking, stop any regular Alcohol  and or any Recreational drug use.  Wear Seat belts while driving.   Please note  You were cared for by a hospitalist during your hospital stay. If you have any questions about your discharge medications or the care you received while you were in the hospital after you are  discharged, you can call the unit and asked to speak with the hospitalist on call if the hospitalist that took care of you is not available. Once you are discharged, your primary care physician will handle any further medical issues. Please note that NO REFILLS for any discharge medications will be authorized once you are discharged, as it is imperative that you return to your primary care physician (or establish a relationship with a primary care physician if you do not have one) for your aftercare needs so that they can reassess your need for medications and monitor your lab values.

## 2014-01-16 LAB — URINE CULTURE: Colony Count: >=100000

## 2014-02-19 DEATH — deceased

## 2015-01-31 IMAGING — CR DG ABDOMEN 1V
1 series · 1 of 1 positions shown · non-contrast
Comparison: 08/09/2013

CLINICAL DATA: Shoulder surgery

EXAM:
ABDOMEN - 1 VIEW

[AP]
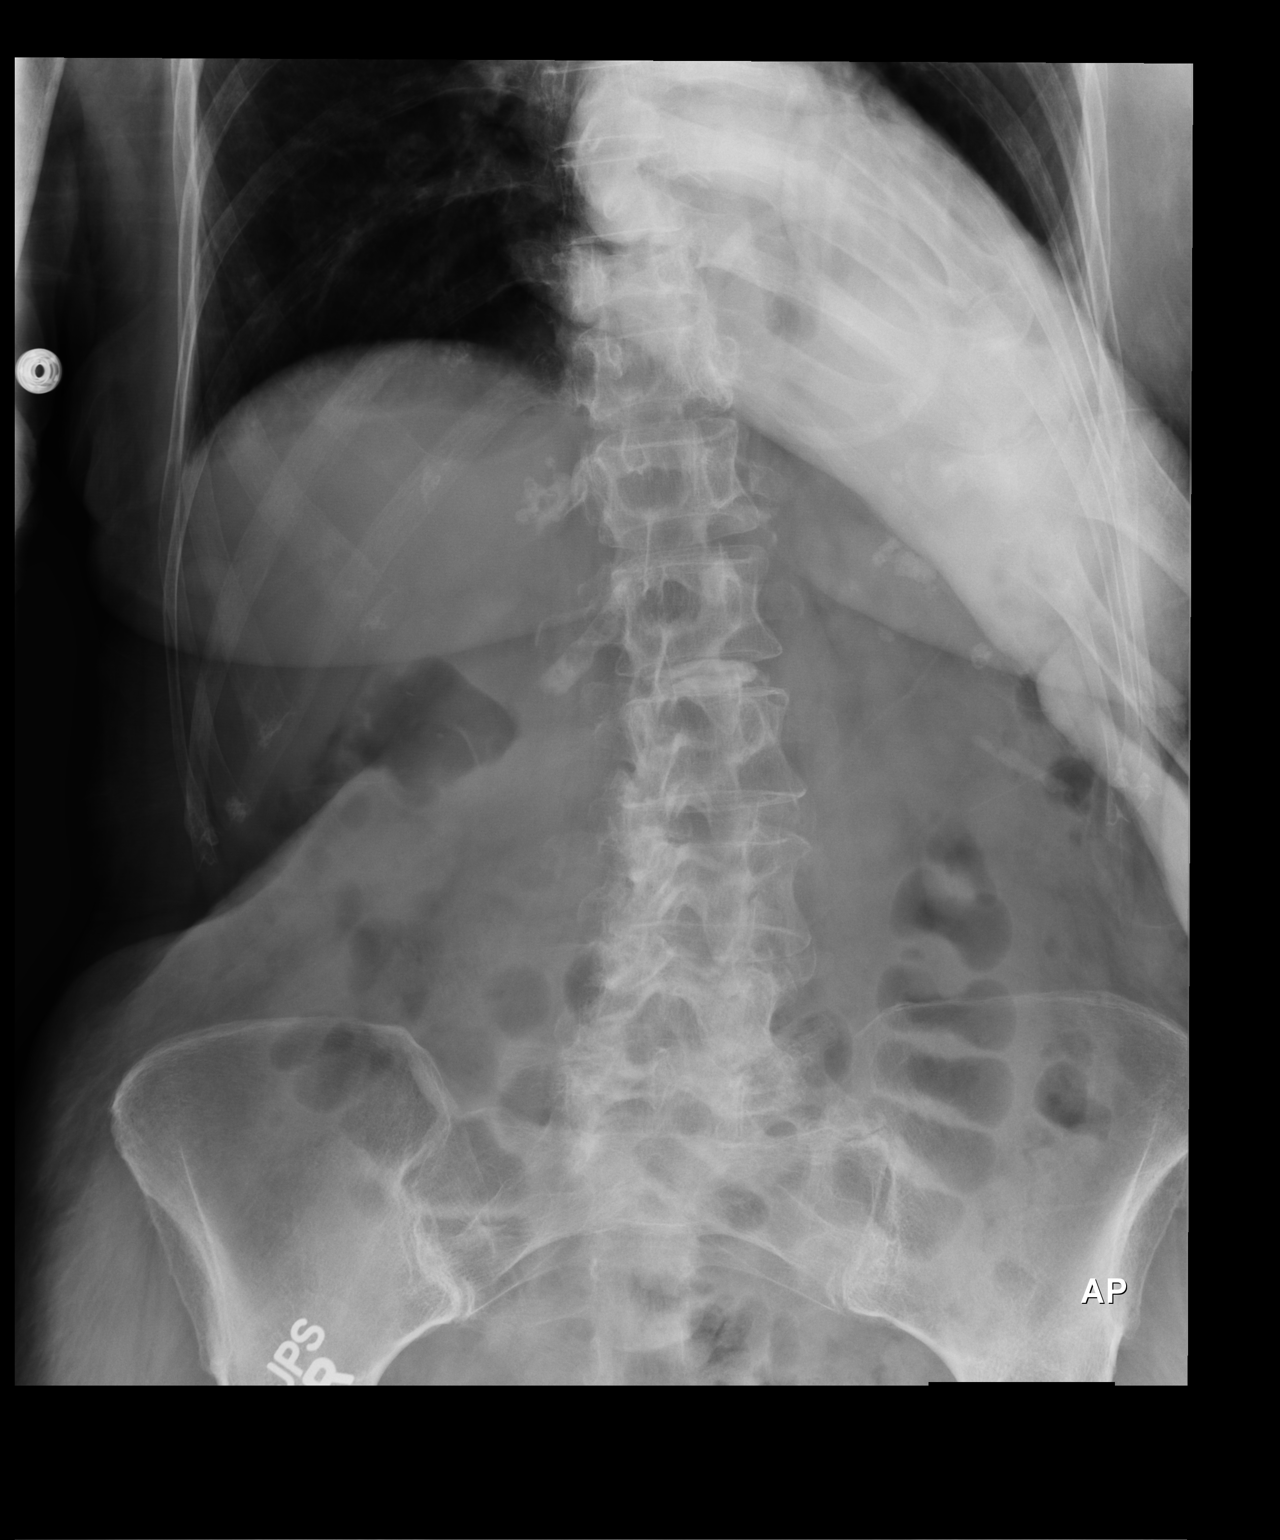

[1 of 1 positions shown; findings below may reference images not displayed]

FINDINGS: No dilated loops of large or small bowel. There is gas and stool in
the rectum. Degenerative changes of the spine.
IMPRESSION: No evidence of bowel obstruction or ileus.

## 2015-06-10 IMAGING — CR DG CHEST 2V
2 series · 2 of 2 positions shown · non-contrast
Comparison: 08/30/2013

CLINICAL DATA: Altered mental status and chest pain. Initial
encounter

EXAM:
CHEST  2 VIEW

[w chest lat]
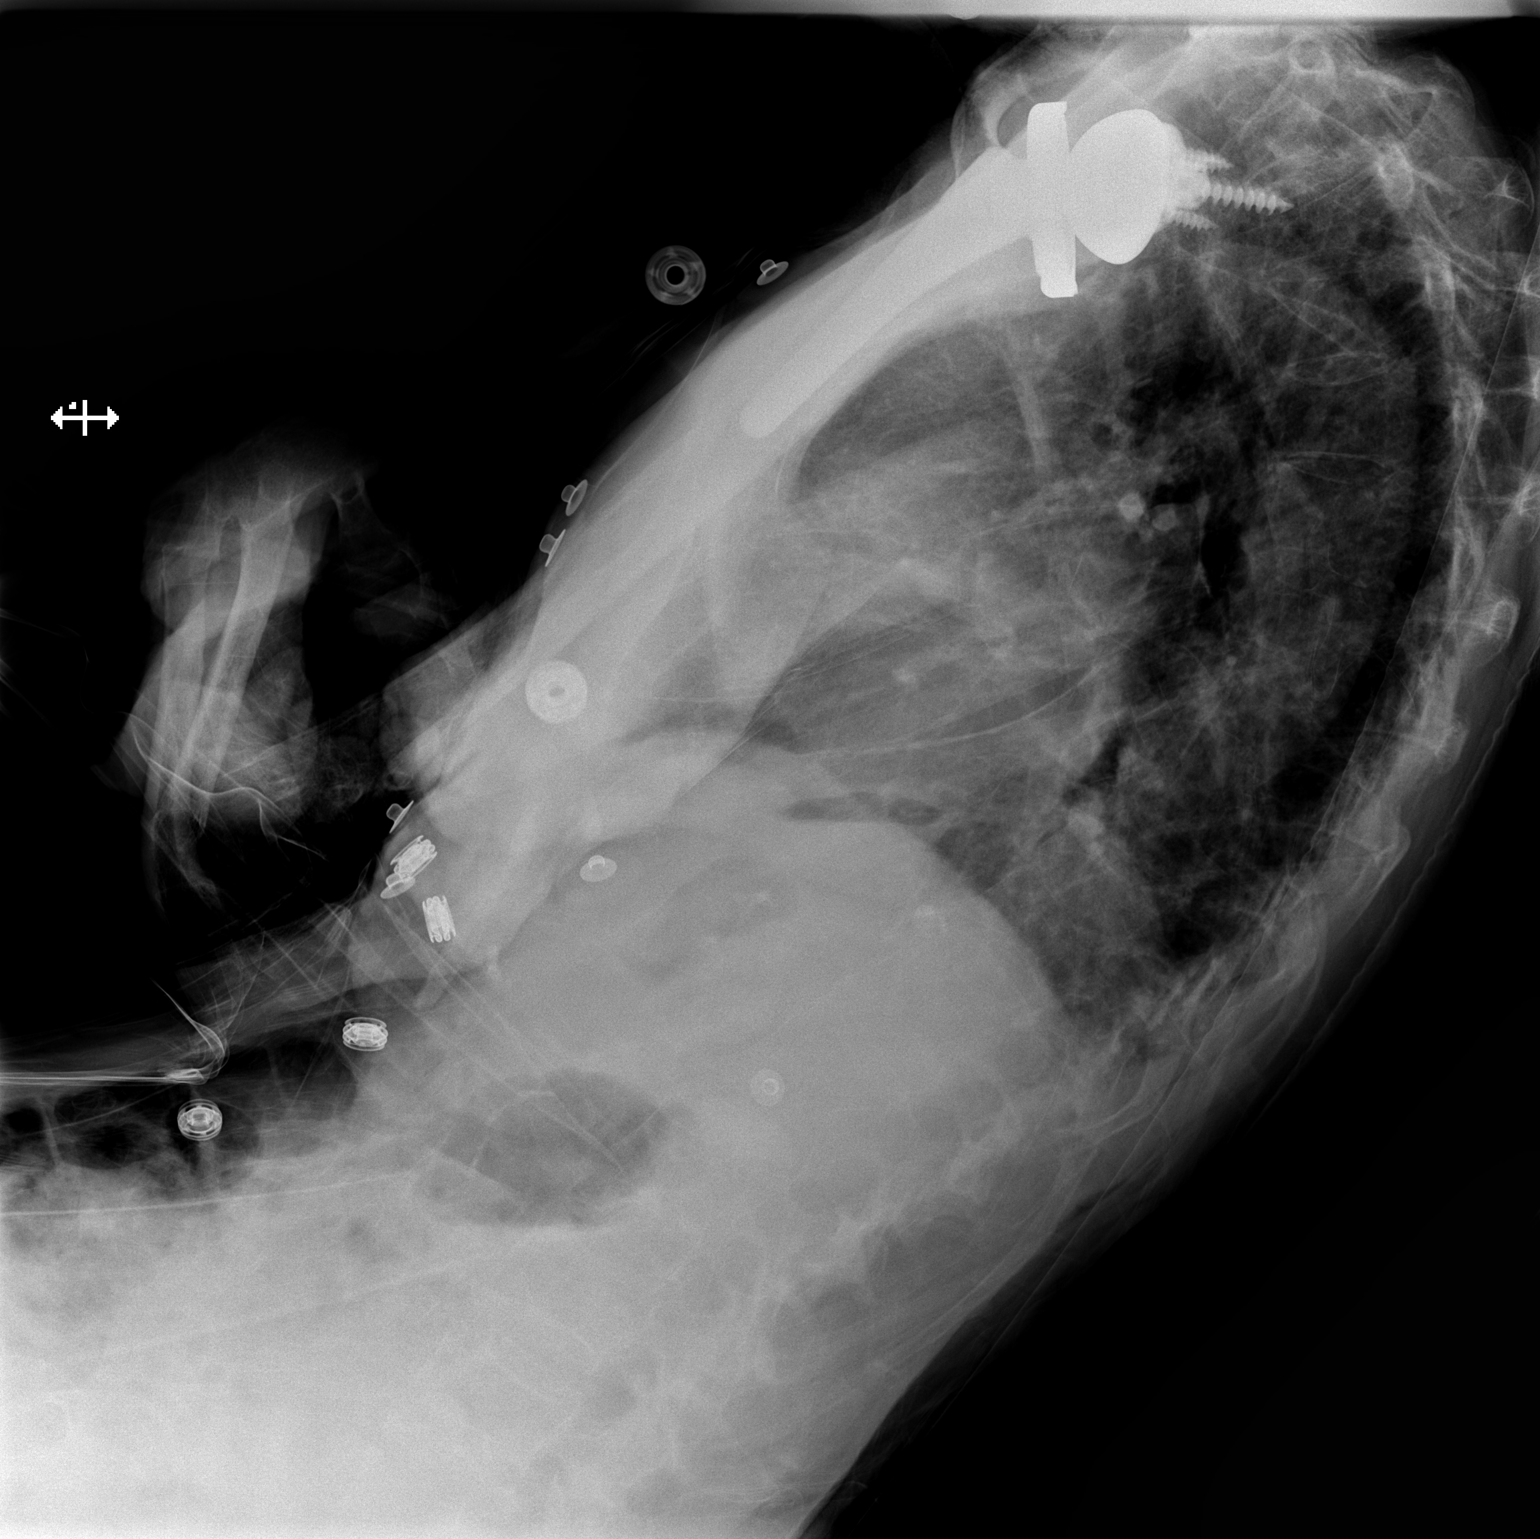

[x chest ap]
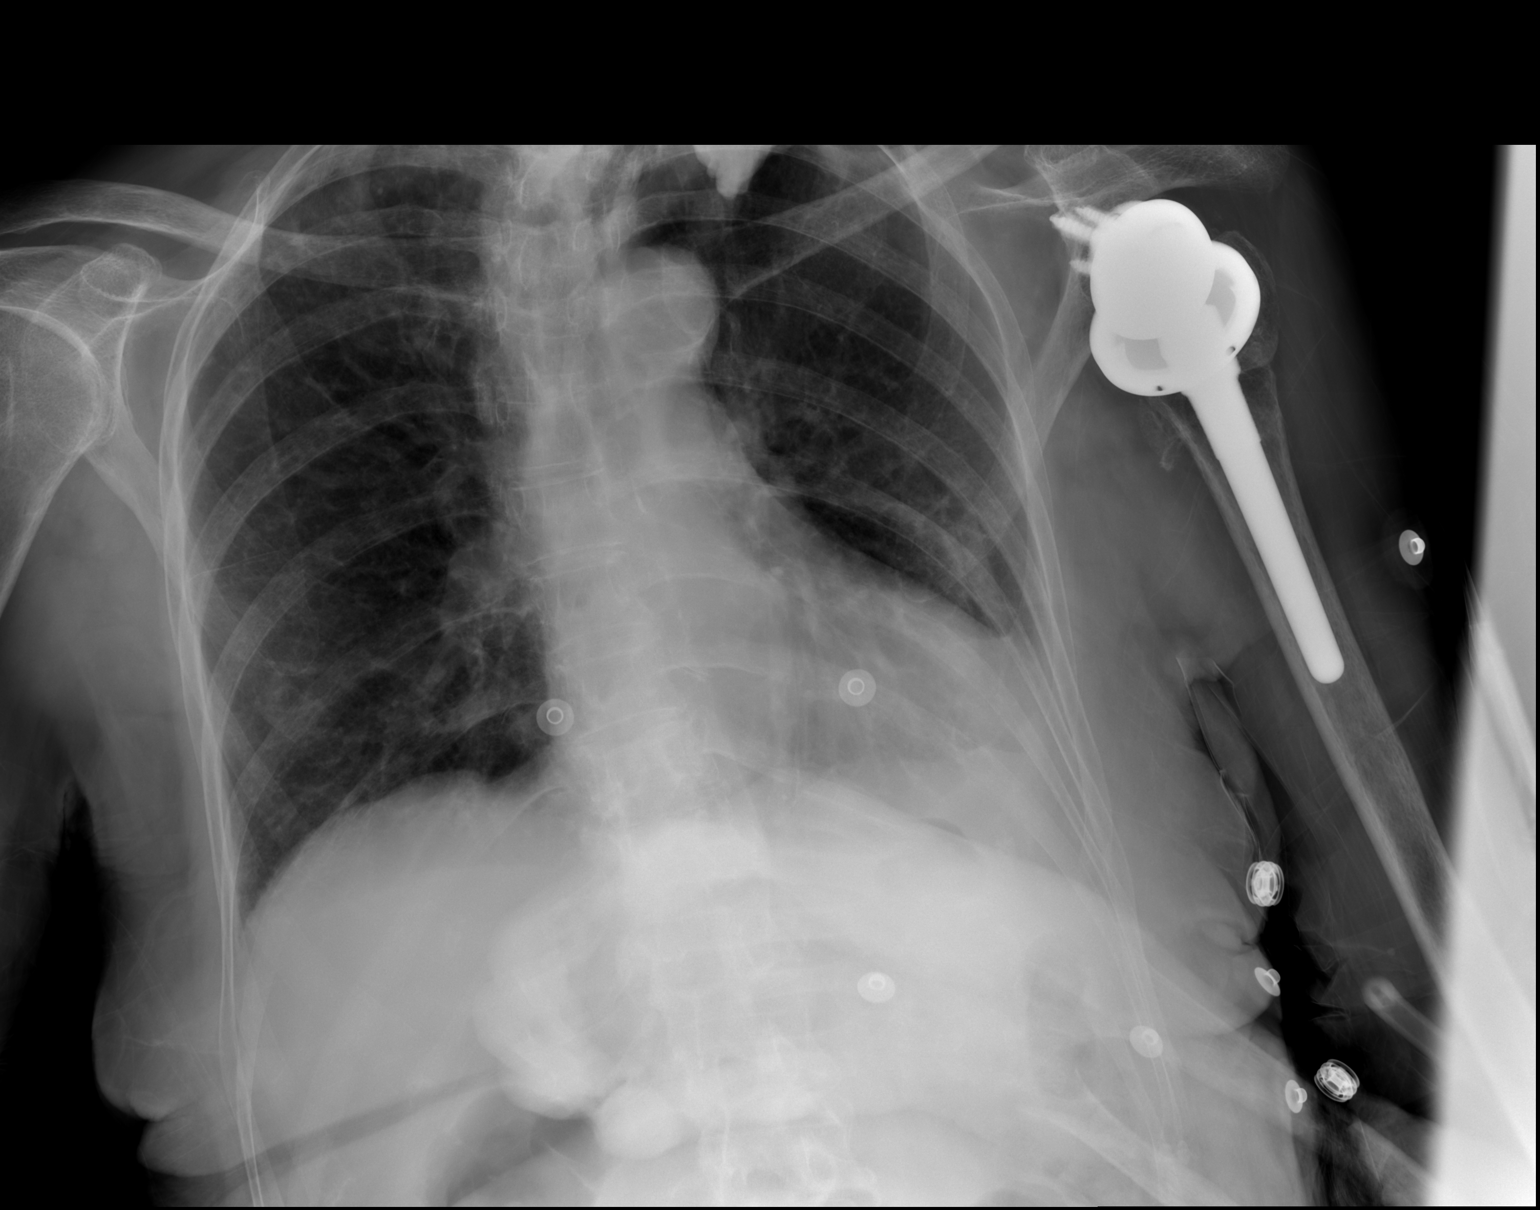

[2 of 2 positions shown; findings below may reference images not displayed]

FINDINGS: Borderline cardiomegaly which is accentuated by rotation. Stable
aortic and upper mediastinal contours.

No suspected edema or pneumonia. No effusion or visible
pneumothorax.

Left glenohumeral arthroplasty. Lower thoracic compression
deformity, likely T10, that is chronic. No acute osseous findings.
IMPRESSION: No active cardiopulmonary disease.
